# Patient Record
Sex: Male | Born: 1995 | Race: White | Hispanic: No | Marital: Single | Smoking: Never smoker
Health system: Southern US, Community
[De-identification: ages and names within clinical notes are randomized; demographics above are authoritative.]

## PROBLEM LIST (undated history)

## (undated) VITALS — BP 75/44 | HR 102 | Temp 97.6°F | Resp 17 | Ht 70.5 in | Wt 176.4 lb

## (undated) DIAGNOSIS — F419 Anxiety disorder, unspecified: Secondary | ICD-10-CM

## (undated) DIAGNOSIS — F909 Attention-deficit hyperactivity disorder, unspecified type: Secondary | ICD-10-CM

## (undated) DIAGNOSIS — F319 Bipolar disorder, unspecified: Secondary | ICD-10-CM

## (undated) HISTORY — DX: Bipolar disorder, unspecified: F31.9

## (undated) HISTORY — PX: NO PAST SURGERIES: SHX2092

---
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
## 2016-08-30 ENCOUNTER — Encounter (HOSPITAL_COMMUNITY): Payer: Self-pay | Admitting: Emergency Medicine

## 2016-08-30 ENCOUNTER — Emergency Department (HOSPITAL_COMMUNITY)
Admission: EM | Admit: 2016-08-30 | Discharge: 2016-08-30 | Disposition: A | Discharge status: Home / Self Care | Payer: BLUE CROSS/BLUE SHIELD | Attending: Emergency Medicine | Admitting: Emergency Medicine

## 2016-08-30 DIAGNOSIS — Y9389 Activity, other specified: Secondary | ICD-10-CM | POA: Insufficient documentation

## 2016-08-30 DIAGNOSIS — W268XXA Contact with other sharp object(s), not elsewhere classified, initial encounter: Secondary | ICD-10-CM | POA: Diagnosis not present

## 2016-08-30 DIAGNOSIS — Y999 Unspecified external cause status: Secondary | ICD-10-CM | POA: Diagnosis not present

## 2016-08-30 DIAGNOSIS — S61211A Laceration without foreign body of left index finger without damage to nail, initial encounter: Secondary | ICD-10-CM | POA: Diagnosis present

## 2016-08-30 DIAGNOSIS — F909 Attention-deficit hyperactivity disorder, unspecified type: Secondary | ICD-10-CM | POA: Diagnosis not present

## 2016-08-30 DIAGNOSIS — Y929 Unspecified place or not applicable: Secondary | ICD-10-CM | POA: Diagnosis not present

## 2016-08-30 DIAGNOSIS — S61412A Laceration without foreign body of left hand, initial encounter: Secondary | ICD-10-CM

## 2016-08-30 DIAGNOSIS — Z79899 Other long term (current) drug therapy: Secondary | ICD-10-CM | POA: Insufficient documentation

## 2016-08-30 MED ORDER — LIDOCAINE HCL 2 % IJ SOLN
10.0000 mL | Freq: Once | INTRAMUSCULAR | Status: AC
  Administered 2016-08-30: 200 mg
  Filled 2016-08-30: qty 20

## 2016-08-30 NOTE — ED Triage Notes (Addendum)
Patient reports left hand laceration from a piece of wood while moving furniture. Bleeding controlled.

## 2016-08-30 NOTE — ED Provider Notes (Signed)
[facility]-EMERGENCY DEPT Provider Note   CSN: [id number] Arrival date & time: 08/30/16  2021  By signing my name below, I, Alyssa GroveMartin Green, attest that this documentation has been prepared under the direction and in the presence of Newell RubbermaidJeffrey Xachary Hambly, PA-C. Electronically Signed: Alyssa GroveMartin Green, ED Scribe. 08/30/16. 10:08 PM.  History   Chief Complaint Chief Complaint  Patient presents with  . Laceration   The history is provided by the patient. No language interpreter was used.   HPI Comments: Calvin [name] is a 20 y.o. male who presents to the Emergency Department complaining of a laceration to the fist knuckle of the left index finger onset 3-4 hours. Pt was moving furniture when a piece of wood cut his hand. Bleeding is controlled without bandage or pressure. Pt denies numbness. Pt is unaware of last tetanus vaccination.  Past Medical History:  Diagnosis Date  . ADHD (attention deficit hyperactivity disorder)   . Anxiety   . Medical history non-contributory     Patient Active Problem List   Diagnosis Date Noted  . ADHD (attention deficit hyperactivity disorder), combined type 07/20/2013  . MDD (major depressive disorder), single episode, moderate (HCC) 07/20/2013  . GAD (generalized anxiety disorder) 07/20/2013    Past Surgical History:  Procedure Laterality Date  . NO PAST SURGERIES      Home Medications    Prior to Admission medications   Medication Sig Start Date End Date Taking? Authorizing Provider  lisdexamfetamine (VYVANSE) 40 MG capsule Take 1 capsule (40 mg total) by mouth daily. 07/27/13   Chauncey MannGlenn E Jennings, MD  mirtazapine (REMERON) 30 MG tablet Take 1 tablet (30 mg total) by mouth at bedtime. 07/26/13   Chauncey MannGlenn E Jennings, MD    Family History Family History  Problem Relation Age of Onset  . Anxiety disorder Mother   . Depression Sister     Social History Social History  Substance Use Topics  . Smoking status: Never Smoker  . Smokeless tobacco: Never Used  .  Alcohol use No     Allergies   Sulfa antibiotics   Review of Systems Review of Systems  Constitutional: Negative for fever.  Skin: Positive for wound.  Neurological: Negative for numbness.  All other systems reviewed and are negative.  Physical Exam Updated Vital Signs BP 126/72 (BP Location: Left Arm)   Pulse 96   Temp 98.5 F (36.9 C) (Oral)   Resp 16   Ht 6' (1.829 m)   Wt 78 kg   SpO2 99%   BMI 23.33 kg/m   Physical Exam  Constitutional: He is oriented to person, place, and time. He appears well-developed and well-nourished. He is active. No distress.  HENT:  Head: Normocephalic and atraumatic.  Eyes: Conjunctivae are normal.  Cardiovascular: Normal rate.   Pulmonary/Chest: Effort normal. No respiratory distress.  Musculoskeletal: Normal range of motion.  Neurological: He is alert and oriented to person, place, and time.  Sensation intact  Skin: Skin is warm and dry.  Left index with a curved laceration just distal to the MCP Approximately 2 cm no deep space involment No involvement in joint  Psychiatric: He has a normal mood and affect. His behavior is normal.  Nursing note and vitals reviewed.  ED Treatments / Results  DIAGNOSTIC STUDIES: Oxygen Saturation is 99% on RA, normal by my interpretation.    COORDINATION OF CARE: 8:42 PM Discussed treatment plan with pt at bedside which includes T-dap injection and laceration repair and pt agreed to plan.  Labs (all  labs ordered are listed, but only abnormal results are displayed) Labs Reviewed - No data to display  EKG  EKG Interpretation None       Radiology No results found.  Procedures Procedures (including critical care time)  LACERATION REPAIR Performed by: Thermon LeylandHedges,Marvel Mcphillips Todd Authorized by: Thermon LeylandHedges,Neill Jurewicz Todd Consent: Verbal consent obtained. Risks and benefits: risks, benefits and alternatives were discussed Consent given by: patient Patient identity confirmed: provided demographic  data Prepped and Draped in normal sterile fashion Wound explored  Laceration Location: Left index  Laceration Length: 2 cm  No Foreign Bodies seen or palpated  Anesthesia: local infiltration  Local anesthetic: lidocaine 2% 0 epinephrine  Anesthetic total: 2 ml  Irrigation method: syringe Amount of cleaning: standard  Skin closure: Simple   Number of sutures: 6 Prolene   Technique: Simple interrupted   Patient tolerance: Patient tolerated the procedure well with no immediate complications.  Medications Ordered in ED Medications  lidocaine (XYLOCAINE) 2 % (with pres) injection 200 mg (200 mg Infiltration Given by Other 08/30/16 2057)     Initial Impression / Assessment and Plan / ED Course  I have reviewed the triage vital signs and the nursing notes.  Pertinent labs & imaging results that were available during my care of the patient were reviewed by me and considered in my medical decision making (see chart for details).  Clinical Course    I personally performed the services described in this documentation, which was scribed in my presence. The recorded information has been reviewed and is accurate.    20 year old male presents with laceration to his finger. No deep space involvement, wound repaired without complication. Wound care instructions given, follow-up indications given. Patient verbalized understanding and agreement today's plan had no further questions or concerns  Final Clinical Impressions(s) / ED Diagnoses   Final diagnoses:  Laceration of left hand, foreign body presence unspecified, initial encounter    New Prescriptions New Prescriptions   No medications on file     Eyvonne MechanicJeffrey Wilman Tucker, PA-C 08/30/16 2208    Arby BarretteMarcy Pfeiffer, MD 08/31/16 0002

## 2016-08-30 NOTE — Discharge Instructions (Signed)
Please read attached information. If you experience any new or worsening signs or symptoms please return to the emergency room for evaluation. Please follow-up with your primary care provider or specialist as discussed.  °

## 2017-04-03 ENCOUNTER — Encounter (HOSPITAL_COMMUNITY): Payer: Self-pay | Admitting: Emergency Medicine

## 2017-04-03 ENCOUNTER — Ambulatory Visit (HOSPITAL_COMMUNITY)
Admission: RE | Admit: 2017-04-03 | Discharge: 2017-04-03 | Disposition: A | Discharge status: Home / Self Care | Payer: BLUE CROSS/BLUE SHIELD | Source: Home / Self Care | Attending: Psychiatry | Admitting: Psychiatry

## 2017-04-03 ENCOUNTER — Other Ambulatory Visit: Payer: Self-pay | Admitting: Family

## 2017-04-03 ENCOUNTER — Emergency Department (HOSPITAL_COMMUNITY)
Admission: EM | Admit: 2017-04-03 | Discharge: 2017-04-05 | Disposition: A | Discharge status: Other Acute Inpatient Hospital | Payer: BLUE CROSS/BLUE SHIELD | Attending: Emergency Medicine | Admitting: Emergency Medicine

## 2017-04-03 DIAGNOSIS — R45851 Suicidal ideations: Secondary | ICD-10-CM

## 2017-04-03 DIAGNOSIS — Z046 Encounter for general psychiatric examination, requested by authority: Secondary | ICD-10-CM | POA: Diagnosis not present

## 2017-04-03 DIAGNOSIS — F909 Attention-deficit hyperactivity disorder, unspecified type: Secondary | ICD-10-CM

## 2017-04-03 DIAGNOSIS — F329 Major depressive disorder, single episode, unspecified: Secondary | ICD-10-CM | POA: Insufficient documentation

## 2017-04-03 DIAGNOSIS — F332 Major depressive disorder, recurrent severe without psychotic features: Secondary | ICD-10-CM | POA: Diagnosis present

## 2017-04-03 DIAGNOSIS — Z1389 Encounter for screening for other disorder: Secondary | ICD-10-CM | POA: Insufficient documentation

## 2017-04-03 DIAGNOSIS — F419 Anxiety disorder, unspecified: Secondary | ICD-10-CM | POA: Insufficient documentation

## 2017-04-03 DIAGNOSIS — Z818 Family history of other mental and behavioral disorders: Secondary | ICD-10-CM | POA: Diagnosis not present

## 2017-04-03 DIAGNOSIS — T1491XA Suicide attempt, initial encounter: Secondary | ICD-10-CM | POA: Diagnosis not present

## 2017-04-03 DIAGNOSIS — X838XXA Intentional self-harm by other specified means, initial encounter: Secondary | ICD-10-CM | POA: Diagnosis not present

## 2017-04-03 LAB — CBC
HCT: 39.8 % (ref 39.0–52.0)
HEMOGLOBIN: 13.7 g/dL (ref 13.0–17.0)
MCH: 30.9 pg (ref 26.0–34.0)
MCHC: 34.4 g/dL (ref 30.0–36.0)
MCV: 89.6 fL (ref 78.0–100.0)
Platelets: 193 10*3/uL (ref 150–400)
RBC: 4.44 MIL/uL (ref 4.22–5.81)
RDW: 12.2 % (ref 11.5–15.5)
WBC: 2.8 10*3/uL — ABNORMAL LOW (ref 4.0–10.5)

## 2017-04-03 LAB — COMPREHENSIVE METABOLIC PANEL
ALT: 11 U/L — ABNORMAL LOW (ref 17–63)
AST: 17 U/L (ref 15–41)
Albumin: 4.4 g/dL (ref 3.5–5.0)
Alkaline Phosphatase: 52 U/L (ref 38–126)
Anion gap: 5 (ref 5–15)
BUN: 16 mg/dL (ref 6–20)
CHLORIDE: 106 mmol/L (ref 101–111)
CO2: 27 mmol/L (ref 22–32)
CREATININE: 0.75 mg/dL (ref 0.61–1.24)
Calcium: 9.5 mg/dL (ref 8.9–10.3)
GFR calc Af Amer: >60 mL/min (ref >60)
Glucose, Bld: 103 mg/dL — ABNORMAL HIGH (ref 65–99)
Potassium: 4.2 mmol/L (ref 3.5–5.1)
Sodium: 138 mmol/L (ref 135–145)
Total Bilirubin: 0.8 mg/dL (ref 0.3–1.2)
Total Protein: 7.1 g/dL (ref 6.5–8.1)

## 2017-04-03 LAB — RAPID URINE DRUG SCREEN, HOSP PERFORMED
AMPHETAMINES: NOT DETECTED
BARBITURATES: NOT DETECTED
Benzodiazepines: NOT DETECTED
Cocaine: NOT DETECTED
Opiates: NOT DETECTED
Tetrahydrocannabinol: POSITIVE — AB

## 2017-04-03 LAB — ETHANOL: Alcohol, Ethyl (B): <5 mg/dL (ref <5)

## 2017-04-03 LAB — ACETAMINOPHEN LEVEL: Acetaminophen (Tylenol), Serum: <10 ug/mL — ABNORMAL LOW (ref 10–30)

## 2017-04-03 LAB — SALICYLATE LEVEL: Salicylate Lvl: <7 mg/dL (ref 2.8–30.0)

## 2017-04-03 MED ORDER — ALUM & MAG HYDROXIDE-SIMETH 200-200-20 MG/5ML PO SUSP: 30.0000 mL | Freq: Four times a day (QID) | ORAL | Status: DC | PRN

## 2017-04-03 MED ORDER — ACETAMINOPHEN 325 MG PO TABS: 650.0000 mg | ORAL_TABLET | ORAL | Status: DC | PRN

## 2017-04-03 MED ORDER — ONDANSETRON HCL 4 MG PO TABS: 4.0000 mg | ORAL_TABLET | Freq: Three times a day (TID) | ORAL | Status: DC | PRN

## 2017-04-03 MED ORDER — ZOLPIDEM TARTRATE 5 MG PO TABS
5.0000 mg | ORAL_TABLET | Freq: Every evening | ORAL | Status: DC | PRN
  Administered 2017-04-04: 5 mg via ORAL
  Filled 2017-04-03: qty 1

## 2017-04-03 MED ORDER — IBUPROFEN 200 MG PO TABS: 600.0000 mg | ORAL_TABLET | Freq: Three times a day (TID) | ORAL | Status: DC | PRN
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
## 2017-04-03 NOTE — ED Notes (Signed)
Pt admitted to room #43. Pt behavior cooperative. Pt endorsing increase in depression. Endorsing passive SI, verbally contracts for safety. Pt denies HI. Denies AVH. Pt reports SA two days ago by putting a belt around his neck. Pt reports he has been off medication for 3 years. Encouragement and support provided. Special checks q 15 mins in place for safety, Video monitoring in place. Will continue to monitor.

## 2017-04-03 NOTE — ED Notes (Signed)
Bed: [facility] Expected date:  Expected time:  Means of arrival:  Comments: 

## 2017-04-03 NOTE — H&P (Signed)
Behavioral Health Medical Screening Exam  Calvin [name] is an 21 y.o. male.  Total Time spent with patient: 30 minutes  Psychiatric Specialty Exam: Physical Exam  Nursing note and vitals reviewed. Constitutional: He is oriented to person, place, and time. He appears well-developed.  Cardiovascular: Normal rate.   Neurological: He is alert and oriented to person, place, and time.  Psychiatric: He has a normal mood and affect. His behavior is normal.    Review of Systems  Psychiatric/Behavioral: Positive for depression and suicidal ideas. The patient is nervous/anxious.     There were no vitals taken for this visit.There is no height or weight on file to calculate BMI.  General Appearance: Casual and Guarded  Eye Contact:  Fair  Speech:  Clear and Coherent  Volume:  Normal  Mood:  Anxious and Depressed  Affect:  Appropriate and Congruent  Thought Process:  Coherent  Orientation:  Full (Time, Place, and Person)  Thought Content:  Hallucinations: Auditory and Rumination  Suicidal Thoughts:  Yes.  with intent/plan  Homicidal Thoughts:  No  Memory:  Immediate;   Good Recent;   Good Remote;   Good  Judgement:  Good  Insight:  Present  Psychomotor Activity:  Normal  Concentration: Concentration: Fair  Recall:  Fis[employer]Fair  Fund of Knowledge:Fair  Language: Fair  Akathisia:  No  Handed:  Right  AIMS (if indicated):     Assets:  Communication Skills Desire for Improvement Resilience Social Support  Sleep:       Musculoskeletal: Strength & Muscle Tone: within normal limits Gait & Station: normal Patient leans: N/A  There were no vitals taken for this visit. B/P 117/53 HR 70 RR 16 Temp 98.2 Recommendations:  Based on my evaluation the patient does not appear to have an emergency medical condition.  Oneta Rackanika N Jeannine Pennisi, NP 04/03/2017, 2:33 PM

## 2017-04-03 NOTE — ED Notes (Signed)
Pt has requested to leave. Attempted to call EDP will attempt again in 10 min.

## 2017-04-03 NOTE — ED Triage Notes (Signed)
Pt reports he has been having SI. Pt also reports he has been having impulsive and at times violent behavior. Pt tried to smother himself 3 days ago in a suicide attempt. No acute trigger. No HI. Uses marijuana occasionally.

## 2017-04-03 NOTE — ED Notes (Signed)
SBAR Report received from previous nurse. Pt received asleep on unit. Pt did not respond to questions related to current SI/ HI, A/V H, depression, anxiety, or pain at this time, and appears otherwise stable and free of distress. Pt reminded of camera surveillance, q 15 min rounds, and rules of the milieu. Will continue to assess.

## 2017-04-03 NOTE — ED Provider Notes (Signed)
[facility]-EMERGENCY DEPT Provider Note   CSN: [id number] Arrival date & time: 04/03/17  1501     History   Chief Complaint Chief Complaint  Patient presents with  . Suicidal    HPI Calvin [name] is a 21 y.o. male.  Patient presents with complaint of suicidal ideation with recent attempt. Patient endorses worsening suicidal ideation and depression recently. He states that several days ago he tried to hang himself with a belt. He has a bruise around his neck from this attempt. He denies being on any medications. No drug or alcohol other than marijuana. Denies any current medical complaints. No neck pain or difficulty breathing or swallowing. States that he was encouraged to come seek help by his girlfriend.      Past Medical History:  Diagnosis Date  . ADHD (attention deficit hyperactivity disorder)   . Anxiety   . Medical history non-contributory     Patient Active Problem List   Diagnosis Date Noted  . ADHD (attention deficit hyperactivity disorder), combined type 07/20/2013  . MDD (major depressive disorder), single episode, moderate (HCC) 07/20/2013  . GAD (generalized anxiety disorder) 07/20/2013    Past Surgical History:  Procedure Laterality Date  . NO PAST SURGERIES         Home Medications    Prior to Admission medications   Medication Sig Start Date End Date Taking? Authorizing Provider  lisdexamfetamine (VYVANSE) 40 MG capsule Take 1 capsule (40 mg total) by mouth daily. Patient not taking: Reported on 04/03/2017 07/27/13   [name], MD  mirtazapine (REMERON) 30 MG tablet Take 1 tablet (30 mg total) by mouth at bedtime. Patient not taking: Reported on 04/03/2017 07/26/13   [name], MD    Family History Family History  Problem Relation Age of Onset  . Anxiety disorder Mother   . Depression Sister     Social History Social History  Substance Use Topics  . Smoking status: Never Smoker  . Smokeless tobacco: Never Used  . Alcohol  use No     Allergies   Sulfa antibiotics   Review of Systems Review of Systems  Constitutional: Negative for fever.  HENT: Negative for rhinorrhea and sore throat.   Eyes: Negative for redness.  Respiratory: Negative for cough.   Cardiovascular: Negative for chest pain.  Gastrointestinal: Negative for abdominal pain, diarrhea, nausea and vomiting.  Genitourinary: Negative for dysuria.  Musculoskeletal: Negative for myalgias.  Skin: Negative for rash.  Neurological: Negative for headaches.  Psychiatric/Behavioral: Positive for dysphoric mood, self-injury and suicidal ideas.     Physical Exam Updated Vital Signs BP 129/74 (BP Location: Right Arm)   Pulse 67   Temp 98.1 F (36.7 C) (Oral)   Resp 18   SpO2 100%   Physical Exam  Constitutional: He appears well-developed and well-nourished.  HENT:  Head: Normocephalic and atraumatic.  Eyes: Conjunctivae are normal. Right eye exhibits no discharge. Left eye exhibits no discharge.  Neck: Normal range of motion. Neck supple.  Linear ecchymosis around the left lateral neck consistent with ligature.  Cardiovascular: Normal rate, regular rhythm and normal heart sounds.   Pulmonary/Chest: Effort normal and breath sounds normal.  Abdominal: Soft. There is no tenderness.  Neurological: He is alert.  Skin: Skin is warm and dry.  Psychiatric: He has a normal mood and affect. His speech is normal and behavior is normal. He expresses suicidal ideation. He expresses suicidal plans.  Nursing note and vitals reviewed.    ED Treatments / Results  Labs (all labs ordered are listed, but only abnormal results are displayed) Labs Reviewed  COMPREHENSIVE METABOLIC PANEL - Abnormal; Notable for the following:       Result Value   Glucose, Bld 103 (*)    ALT 11 (*)    All other components within normal limits  ACETAMINOPHEN LEVEL - Abnormal; Notable for the following:    Acetaminophen (Tylenol), Serum <10 (*)    All other components  within normal limits  CBC - Abnormal; Notable for the following:    WBC 2.8 (*)    All other components within normal limits  RAPID URINE DRUG SCREEN, HOSP PERFORMED - Abnormal; Notable for the following:    Tetrahydrocannabinol POSITIVE (*)    All other components within normal limits  ETHANOL  SALICYLATE LEVEL    EKG  EKG Interpretation None       Radiology No results found.  Procedures Procedures (including critical care time)  Medications Ordered in ED Medications  acetaminophen (TYLENOL) tablet 650 mg (not administered)  alum & mag hydroxide-simeth (MAALOX/MYLANTA) 200-200-20 MG/5ML suspension 30 mL (not administered)  ibuprofen (ADVIL,MOTRIN) tablet 600 mg (not administered)  ondansetron (ZOFRAN) tablet 4 mg (not administered)  zolpidem (AMBIEN) tablet 5 mg (not administered)     Initial Impression / Assessment and Plan / ED Course  I have reviewed the triage vital signs and the nursing notes.  Pertinent labs & imaging results that were available during my care of the patient were reviewed by me and considered in my medical decision making (see chart for details).     Patient seen and examined. TTS consult ordered. Patient will likely need inpatient treatment.  Vital signs reviewed and are as follows: BP 129/74 (BP Location: Right Arm)   Pulse 67   Temp 98.1 F (36.7 C) (Oral)   Resp 18   SpO2 100%     Final Clinical Impressions(s) / ED Diagnoses   Final diagnoses:  Suicidal ideation   Pending placement.   New Prescriptions New Prescriptions   No medications on file     Renne CriglerGeiple, Nevan Creighton, Cordelia Poche-C 04/03/17 2231    Shaune PollackIsaacs, Cameron, MD 04/06/17 [phone]

## 2017-04-04 DIAGNOSIS — X838XXA Intentional self-harm by other specified means, initial encounter: Secondary | ICD-10-CM

## 2017-04-04 DIAGNOSIS — Z818 Family history of other mental and behavioral disorders: Secondary | ICD-10-CM

## 2017-04-04 DIAGNOSIS — F332 Major depressive disorder, recurrent severe without psychotic features: Secondary | ICD-10-CM | POA: Diagnosis not present

## 2017-04-04 DIAGNOSIS — T1491XA Suicide attempt, initial encounter: Secondary | ICD-10-CM | POA: Diagnosis not present

## 2017-04-04 DIAGNOSIS — F1290 Cannabis use, unspecified, uncomplicated: Secondary | ICD-10-CM

## 2017-04-04 MED ORDER — TRAZODONE HCL 50 MG PO TABS: 50.0000 mg | ORAL_TABLET | Freq: Every evening | ORAL | Status: DC | PRN

## 2017-04-04 MED ORDER — LORAZEPAM 2 MG/ML IJ SOLN
1.0000 mg | Freq: Once | INTRAMUSCULAR | Status: DC
Start: 2017-04-04 — End: 2017-04-05

## 2017-04-04 MED ORDER — DIPHENHYDRAMINE HCL 50 MG/ML IJ SOLN: 25.0000 mg | Freq: Once | INTRAMUSCULAR | Status: DC

## 2017-04-04 MED ORDER — CARBAMAZEPINE ER 200 MG PO TB12
200.0000 mg | ORAL_TABLET | Freq: Two times a day (BID) | ORAL | Status: DC
  Administered 2017-04-04 – 2017-04-05 (×3): 200 mg via ORAL
  Filled 2017-04-04 (×3): qty 1

## 2017-04-04 MED ORDER — ZIPRASIDONE MESYLATE 20 MG IM SOLR: 10.0000 mg | Freq: Once | INTRAMUSCULAR | Status: DC

## 2017-04-04 MED ORDER — CITALOPRAM HYDROBROMIDE 10 MG PO TABS
10.0000 mg | ORAL_TABLET | Freq: Every day | ORAL | Status: DC
  Administered 2017-04-04 – 2017-04-05 (×2): 10 mg via ORAL
  Filled 2017-04-04 (×2): qty 1

## 2017-04-04 NOTE — ED Notes (Signed)
Pt became agitated on the phone. RN coached the patient with some breathing exercise which he was able to calm down. The tech had to go back and readdress the  Pt's behavior, he remained upset but calm down within minutes. Notified MD resulted in his agitation.

## 2017-04-04 NOTE — Progress Notes (Signed)
Patient has been [facility] inpatient psych [facility], [facility], [facility], [facility], [facility].  CSW will continue to follow up with placement efforts in the morning.  [place] Abtsatia Mischele Detter, LCSWA Disposition staff 04/04/2017 2:20 PM

## 2017-04-04 NOTE — ED Notes (Signed)
Tech, Charity fundraiserN, and Child psychotherapistsocial worker educated girlfriend and pt on [facility] and [employer]

## 2017-04-04 NOTE — ED Provider Notes (Signed)
Pt's nurse called me b/c pt wants to go home.  He came in earlier in the day with a recent suicide attempt by trying to hang himself with a belt.  [facility].  I told him that it would not be safe for him to go home right now.  He is willing to stay and be reassessed in the morning.  Pt is cooperative.  No need for IVC papers now.   Jacalyn LefevreHaviland, Laruth Hanger, MD 04/04/17 [phone]

## 2017-04-04 NOTE — ED Notes (Signed)
Educated pt on Calvin [name] and the difference between [employer] and [employer]. Pt stated "I want to talk to my girlfriend, so we can talk about rent being paid and my job." He notified his girlfriend and now awaiting on [facility] to [facility].

## 2017-04-04 NOTE — ED Notes (Signed)
EDP met with pt and pt has agreed to stay the night and be re-evaluated in the morning.

## 2017-04-04 NOTE — Progress Notes (Signed)
Calvin [name] from [facility] contacted Clinical research associatewriter and inquired about patient's clinicals. Writer consulted with [facility]-ED RN Angelique Blonderenise and answered these questions (if any infectious diseases, any MR, any medical equipment, any forced meds or restraints, and if patient will be voluntary).  Per [facility] RN Angelique Blonderenise, [facility] but wants to talk to his family first [facility].  Writer awaiting call back from [employer] Denise. Daniel [facility] to be informed of pt status.  [name], LCSWA Disposition staff 04/04/2017 2:50 PM

## 2017-04-04 NOTE — ED Notes (Signed)
SBAR Report received from previous nurse. Pt received calm and visible on unit. Pt denies current SI/ HI, A/V H, depression, anxiety, or pain at this time, and appears otherwise stable and free of distress. Pt reminded of camera surveillance, q 15 min rounds, and rules of the milieu. Will continue to assess. 

## 2017-04-04 NOTE — ED Notes (Signed)
Notified Reuel Boomaniel, RN [facility] will [facility] the am. [address] [place] [address]

## 2017-04-04 NOTE — Progress Notes (Signed)
If patient will be IVC'd please fax [facility] at fax# [phone]. [facility]-ED RN Angelique BlonderDenise was notified.  Calvin [name], LCSWA Disposition staff 04/04/2017 3:19 PM

## 2017-04-04 NOTE — Consult Note (Signed)
[place] [facility] Consult   Reason for Consult:  Suicide attempt per hanging Referring Physician:  EDP Patient Identification: Calvin [name] MRN:  [id number] Principal Diagnosis: Major depressive disorder, recurrent severe without psychotic features [facility]) Diagnosis:   Patient Active Problem List   Diagnosis Date Noted  . Major depressive disorder, recurrent severe without psychotic features ([place]) [F33.2] 04/04/2017    Priority: High  . ADHD (attention deficit hyperactivity disorder), combined type [F90.2] 07/20/2013  . GAD (generalized anxiety disorder) [F41.1] 07/20/2013    Total Time spent with patient: 45 minutes  Subjective:   Calvin [name] is a 21 y.o. male patient admitted with suicide attempt.  HPI:  21 yo male who presented to the ED with suicidal ideations and failed hanging attempt.  He continues to have suicidal ideations but no homicidal ideations, hallucinations, or withdrawal symptoms (uses cannabis).  He used to go to [facility] for counseling but has not been recently.    Past Psychiatric History: depression  Risk to Self: Is patient at risk for suicide?: Yes Risk to Others:  None Prior Inpatient Therapy:  Denies Prior Outpatient [facility]  Past Medical History:  Past Medical History:  Diagnosis Date  . ADHD (attention deficit hyperactivity disorder)   . Anxiety   . Medical history non-contributory     Past Surgical History:  Procedure Laterality Date  . NO PAST SURGERIES     Family History:  Family History  Problem Relation Age of Onset  . Anxiety disorder Mother   . Depression Sister    Family Psychiatric  History: unknown Social History:  History  Alcohol Use No     History  Drug Use  . Types: Marijuana    Comment: once monthly/ 1 blunt/ quit a month ago due to meds     Social History   Social History  . Marital status: Single    Spouse name: N/A  . Number of children: N/A  . Years of education: N/A   Social  History Main Topics  . Smoking status: Never Smoker  . Smokeless tobacco: Never Used  . Alcohol use No  . Drug use: Yes    Types: Marijuana     Comment: once monthly/ 1 blunt/ quit a month ago due to meds   . Sexual activity: No   Other Topics Concern  . None   Social History Narrative  . None   Additional Social History:    Allergies:   Allergies  Allergen Reactions  . Sulfa Antibiotics Rash    Labs:  Results for orders placed or performed during the hospital encounter of 04/03/17 (from the past 48 hour(s))  Rapid urine drug screen (hospital performed)     Status: Abnormal   Collection Time: 04/03/17  3:24 PM  Result Value Ref Range   Opiates NONE DETECTED NONE DETECTED   Cocaine NONE DETECTED NONE DETECTED   Benzodiazepines NONE DETECTED NONE DETECTED   Amphetamines NONE DETECTED NONE DETECTED   Tetrahydrocannabinol POSITIVE (A) NONE DETECTED   Barbiturates NONE DETECTED NONE DETECTED    Comment:        DRUG SCREEN FOR MEDICAL PURPOSES ONLY.  IF CONFIRMATION IS NEEDED FOR ANY PURPOSE, NOTIFY LAB WITHIN 5 DAYS.        LOWEST DETECTABLE LIMITS FOR URINE DRUG SCREEN Drug Class       Cutoff (ng/mL) Amphetamine      1000 Barbiturate      200 Benzodiazepine   637 Tricyclics  300 Opiates          300 Cocaine          300 THC              50   Comprehensive metabolic panel     Status: Abnormal   Collection Time: 04/03/17  3:58 PM  Result Value Ref Range   Sodium 138 135 - 145 mmol/L   Potassium 4.2 3.5 - 5.1 mmol/L   Chloride 106 101 - 111 mmol/L   CO2 27 22 - 32 mmol/L   Glucose, Bld 103 (H) 65 - 99 mg/dL   BUN 16 6 - 20 mg/dL   Creatinine, Ser 0.75 0.61 - 1.24 mg/dL   Calcium 9.5 8.9 - 10.3 mg/dL   Total Protein 7.1 6.5 - 8.1 g/dL   Albumin 4.4 3.5 - 5.0 g/dL   AST 17 15 - 41 U/L   ALT 11 (L) 17 - 63 U/L   Alkaline Phosphatase 52 38 - 126 U/L   Total Bilirubin 0.8 0.3 - 1.2 mg/dL   GFR calc non Af Amer >60 >60 mL/min   GFR calc Af Amer >60 >60  mL/min    Comment: (NOTE) The eGFR has been calculated using the CKD EPI equation. This calculation has not been validated in all clinical situations. eGFR's persistently <60 mL/min signify possible Chronic Kidney Disease.    Anion gap 5 5 - 15  cbc     Status: Abnormal   Collection Time: 04/03/17  3:58 PM  Result Value Ref Range   WBC 2.8 (L) 4.0 - 10.5 K/uL   RBC 4.44 4.22 - 5.81 MIL/uL   Hemoglobin 13.7 13.0 - 17.0 g/dL   HCT 39.8 39.0 - 52.0 %   MCV 89.6 78.0 - 100.0 fL   MCH 30.9 26.0 - 34.0 pg   MCHC 34.4 30.0 - 36.0 g/dL   RDW 12.2 11.5 - 15.5 %   Platelets 193 150 - 400 K/uL  Ethanol     Status: None   Collection Time: 04/03/17  3:59 PM  Result Value Ref Range   Alcohol, Ethyl (B) <5 <5 mg/dL    Comment:        LOWEST DETECTABLE LIMIT FOR SERUM ALCOHOL IS 5 mg/dL FOR MEDICAL PURPOSES ONLY   Salicylate level     Status: None   Collection Time: 04/03/17  3:59 PM  Result Value Ref Range   Salicylate Lvl <1.6 2.8 - 30.0 mg/dL  Acetaminophen level     Status: Abnormal   Collection Time: 04/03/17  3:59 PM  Result Value Ref Range   Acetaminophen (Tylenol), Serum <10 (L) 10 - 30 ug/mL    Comment:        THERAPEUTIC CONCENTRATIONS VARY SIGNIFICANTLY. A RANGE OF 10-30 ug/mL MAY BE AN EFFECTIVE CONCENTRATION FOR MANY PATIENTS. HOWEVER, SOME ARE BEST TREATED AT CONCENTRATIONS OUTSIDE THIS RANGE. ACETAMINOPHEN CONCENTRATIONS >150 ug/mL AT 4 HOURS AFTER INGESTION AND >50 ug/mL AT 12 HOURS AFTER INGESTION ARE OFTEN ASSOCIATED WITH TOXIC REACTIONS.     Current Facility-Administered Medications  Medication Dose Route Frequency Provider Last Rate Last Dose  . acetaminophen (TYLENOL) tablet 650 mg  650 mg Oral Q4H PRN Carlisle Cater, PA-C      . alum & mag hydroxide-simeth (MAALOX/MYLANTA) 200-200-20 MG/5ML suspension 30 mL  30 mL Oral Q6H PRN Carlisle Cater, PA-C      . ibuprofen (ADVIL,MOTRIN) tablet 600 mg  600 mg Oral Q8H PRN Carlisle Cater, PA-C      .  ondansetron  (ZOFRAN) tablet 4 mg  4 mg Oral Q8H PRN Carlisle Cater, PA-C       Current Outpatient Prescriptions  Medication Sig Dispense Refill  . lisdexamfetamine (VYVANSE) 40 MG capsule Take 1 capsule (40 mg total) by mouth daily. (Patient not taking: Reported on 04/03/2017) 30 capsule 0  . mirtazapine (REMERON) 30 MG tablet Take 1 tablet (30 mg total) by mouth at bedtime. (Patient not taking: Reported on 04/03/2017) 30 tablet 0    Musculoskeletal: Strength & Muscle Tone: within normal limits Gait & Station: normal Patient leans: N/A  Psychiatric Specialty Exam: Physical Exam  Constitutional: He is oriented to person, place, and time. He appears well-developed and well-nourished.  HENT:  Head: Normocephalic.  Neck: Normal range of motion.  Respiratory: Effort normal.  Musculoskeletal: Normal range of motion.  Neurological: He is alert and oriented to person, place, and time.  Psychiatric: His speech is normal and behavior is normal. Judgment normal. Cognition and memory are normal. He exhibits a depressed mood. He expresses suicidal ideation. He expresses suicidal plans.    Review of Systems  Psychiatric/Behavioral: Positive for depression and suicidal ideas.  All other systems reviewed and are negative.   Blood pressure (!) 117/57, pulse (!) 51, temperature 97.6 F (36.4 C), temperature source Oral, resp. rate 18, SpO2 100 %.There is no height or weight on file to calculate BMI.  General Appearance: Casual  Eye Contact:  Fair  Speech:  Normal Rate  Volume:  Decreased  Mood:  Depressed  Affect:  Congruent  Thought Process:  Coherent and Descriptions of Associations: Intact  Orientation:  Full (Time, Place, and Person)  Thought Content:  Rumination  Suicidal Thoughts:  Yes.  with intent/plan  Homicidal Thoughts:  No  Memory:  Immediate;   Fair Recent;   Fair Remote;   Fair  Judgement:  Poor  Insight:  Fair  Psychomotor Activity:  Decreased  Concentration:  Concentration: Fair and  Attention Span: Fair  Recall:  [place] of Knowledge:  Good  Language:  Good  Akathisia:  No  Handed:  Right  AIMS (if indicated):     Assets:  Housing Intimacy Leisure Time Physical Health Resilience Social Support  ADL's:  Intact  Cognition:  WNL  Sleep:        Treatment Plan Summary: Daily contact with patient to assess and evaluate symptoms and progress in treatment, Medication management and Plan major depressive disorder, recurrent, severe without psychosis:  -Crisis stabilization -Medication management:  Start Tegretol 200 mg BID for mood stabilization and Celexa 10 mg daily for depression, Trazodone 50 mg at bedtime for sleep PRN -Individual counseling  Disposition: Recommend psychiatric Inpatient admission when medically cleared.  Waylan Boga, NP 04/04/2017 12:00 PM  Patient seen face-to-face for psychiatric evaluation, chart reviewed and case discussed with the physician extender and developed treatment plan. Reviewed the information documented and agree with the treatment plan. Corena Pilgrim, MD

## 2017-04-04 NOTE — Progress Notes (Addendum)
[facility] was [facility], to Dr. Shawnie DapperLopez, going to [facility] building, the bed is ready, please call report at [phone] [facility](RN Allena Katzaniel Williams).   Please follow up with CSW in disposition Catia Tyreece Gelles at [phone] if patient will be IVC'd for transfer.  Melbourne Abtsatia Nathanel Tallman, LCSWA Disposition staff 04/04/2017 3:08 PM

## 2017-04-04 NOTE — ED Notes (Signed)
Creig HinesJames Donovan (father) called, informed him that pt will be going [facility]. Father [facility] the [facility]. Father (Emergency planning/management officerpolice officer) he is familiar with the process.

## 2017-04-05 NOTE — [facility] Counselor (Signed)
Clinician faxed pt's [facility] paperwork [facility] at [phone].    Redmond Pullingreylese D Mumtaz Lovins, MS, [facility], [facility] Triage Specialist [phone]

## 2017-04-05 NOTE — ED Notes (Signed)
[facility] [facility] He was disgruntled because his status was IVC and the doctors would not change him to voluntary. He did not want to [facility]. When the sheriff came he went without difficulty. All belongings returned to pt who signed for same.

## 2017-05-03 ENCOUNTER — Emergency Department (HOSPITAL_COMMUNITY)
Admission: EM | Admit: 2017-05-03 | Discharge: 2017-05-03 | Disposition: A | Discharge status: Home / Self Care | Payer: BLUE CROSS/BLUE SHIELD | Attending: Emergency Medicine | Admitting: Emergency Medicine

## 2017-05-03 ENCOUNTER — Emergency Department (HOSPITAL_COMMUNITY): Payer: BLUE CROSS/BLUE SHIELD

## 2017-05-03 ENCOUNTER — Encounter (HOSPITAL_COMMUNITY): Payer: Self-pay | Admitting: Emergency Medicine

## 2017-05-03 DIAGNOSIS — Y939 Activity, unspecified: Secondary | ICD-10-CM | POA: Diagnosis not present

## 2017-05-03 DIAGNOSIS — S51812A Laceration without foreign body of left forearm, initial encounter: Secondary | ICD-10-CM | POA: Diagnosis not present

## 2017-05-03 DIAGNOSIS — Y999 Unspecified external cause status: Secondary | ICD-10-CM | POA: Insufficient documentation

## 2017-05-03 DIAGNOSIS — W25XXXA Contact with sharp glass, initial encounter: Secondary | ICD-10-CM | POA: Insufficient documentation

## 2017-05-03 DIAGNOSIS — Z79899 Other long term (current) drug therapy: Secondary | ICD-10-CM | POA: Insufficient documentation

## 2017-05-03 DIAGNOSIS — Y929 Unspecified place or not applicable: Secondary | ICD-10-CM | POA: Insufficient documentation

## 2017-05-03 DIAGNOSIS — S41112A Laceration without foreign body of left upper arm, initial encounter: Secondary | ICD-10-CM

## 2017-05-03 MED ORDER — ONDANSETRON 8 MG PO TBDP
8.0000 mg | ORAL_TABLET | Freq: Once | ORAL | Status: AC
  Administered 2017-05-03: 8 mg via ORAL
  Filled 2017-05-03: qty 1

## 2017-05-03 MED ORDER — CEPHALEXIN 500 MG PO CAPS: 500.0000 mg | ORAL_CAPSULE | Freq: Two times a day (BID) | ORAL | 0 refills | Status: DC

## 2017-05-03 MED ORDER — LIDOCAINE-EPINEPHRINE (PF) 2 %-1:200000 IJ SOLN
INTRAMUSCULAR | Status: AC
  Administered 2017-05-03: 20 mL
  Filled 2017-05-03: qty 20

## 2017-05-03 NOTE — ED Notes (Signed)
ED Provider at bedside. 

## 2017-05-03 NOTE — Discharge Instructions (Signed)
Please read attached information. If you experience any new or worsening signs or symptoms please return to the emergency room for evaluation. Please follow-up with your primary care provider or specialist as discussed. Please use medication prescribed only as directed and discontinue taking if you have any concerning signs or symptoms.   °

## 2017-05-03 NOTE — ED Provider Notes (Signed)
[facility]-EMERGENCY DEPT Provider Note   CSN: [id number] Arrival date & time: 05/03/17  1202   By signing my name below, I, Calvin [name], attest that this documentation has been prepared under the direction and in the presence of Calvin Forts, PA-C Electronically Signed: Soijett [name], ED Scribe. 05/03/17. 12:34 PM.  History   Chief Complaint Chief Complaint  Patient presents with  . Arm Injury    HPI  Calvin [name] is a 21 y.o. male who presents to the Emergency Department complaining of left arm injury occurring PTA. Pt reports associated multiple lacerations to left forearm. Pt has tried applying pressure without medications with minimal relief of his symptoms. He notes that he punched a glass window pane on a door prior to the onset of his symptoms. He states that he is unsure of the status of his tetanus vaccination. He denies color change, swelling, left forearm pain, and any other symptoms.    The history is provided by the patient. No language interpreter was used.    Past Medical History:  Diagnosis Date  . ADHD (attention deficit hyperactivity disorder)   . Anxiety   . Medical history non-contributory     Patient Active Problem List   Diagnosis Date Noted  . Major depressive disorder, recurrent severe without psychotic features ([facility]) 04/04/2017  . ADHD (attention deficit hyperactivity disorder), combined type 07/20/2013  . GAD (generalized anxiety disorder) 07/20/2013    Past Surgical History:  Procedure Laterality Date  . NO PAST SURGERIES         Home Medications    Prior to Admission medications   Medication Sig Start Date End Date Taking? Authorizing Provider  divalproex (DEPAKOTE) 500 MG DR tablet Take 500 mg by mouth 2 (two) times daily.   Yes [provider]  cephALEXin (KEFLEX) 500 MG capsule Take 1 capsule (500 mg total) by mouth 2 (two) times daily. 05/03/17   Calvin [name], Calvin Gens, PA-C  lisdexamfetamine (VYVANSE) 40 MG capsule Take 1 capsule (40  mg total) by mouth daily. Patient not taking: Reported on 04/03/2017 07/27/13   Calvin Mann, MD  mirtazapine (REMERON) 30 MG tablet Take 1 tablet (30 mg total) by mouth at bedtime. Patient not taking: Reported on 04/03/2017 07/26/13   Calvin Mann, MD    Family History Family History  Problem Relation Age of Onset  . Anxiety disorder Mother   . Depression Sister     Social History Social History  Substance Use Topics  . Smoking status: Never Smoker  . Smokeless tobacco: Never Used  . Alcohol use No     Allergies   Sulfa antibiotics   Review of Systems Review of Systems  Musculoskeletal: Negative for arthralgias and joint swelling.  Skin: Positive for wound (multiple lacerations to left forearm). Negative for color change.  All other systems reviewed and are negative.    Physical Exam Updated Vital Signs BP 114/61   Pulse 60   Temp 98.2 F (36.8 C) (Oral)   Resp 20   SpO2 100%   Physical Exam  Constitutional: He is oriented to person, place, and time. He appears well-developed and well-nourished. No distress.  HENT:  Head: Normocephalic and atraumatic.  Eyes: EOM are normal.  Neck: Neck supple.  Cardiovascular: Normal rate.   Pulmonary/Chest: Effort normal. No respiratory distress.  Abdominal: He exhibits no distension.  Musculoskeletal: Normal range of motion.       Left forearm: He exhibits laceration.  Numerous lacerations to LUE, several gaping opening, most notably  2 cm laceration on mid forearm, 3 cm laceration to the mid forearm, 4 cm laceration mid forearm, 1.5 cm laceration to the proximal ulnar hand, and 0.5 cm laceration over the left thumb. Various other superficial abrasions and lacerations. No deep space involvement. Full active ROM of wrists, hand, and finger. No major muscle involvement. Sensation intact to entire LUE.  Neurological: He is alert and oriented to person, place, and time.  Skin: Skin is warm and dry.  Psychiatric: He has a  normal mood and affect. His behavior is normal.  Nursing note and vitals reviewed.    ED Treatments / Results  DIAGNOSTIC STUDIES: Oxygen Saturation is 100% on RA, nl by my interpretation.    COORDINATION OF CARE: 12:29 PM Discussed treatment plan with pt at bedside and pt agreed to plan.   Labs (all labs ordered are listed, but only abnormal results are displayed) Labs Reviewed - No data to display  EKG  EKG Interpretation None       Radiology Dg Forearm Left  Result Date: 05/03/2017 CLINICAL DATA:  Laceration of forearm. EXAM: LEFT FOREARM - 2 VIEW COMPARISON:  None. FINDINGS: There is no evidence of fracture or other focal bone lesions. Irregularity is seen involving the volar soft tissues consistent with laceration. No definite radiopaque foreign body is noted. IMPRESSION: Laceration is seen involving the volar soft tissues of the distal left forearm. No definite radiopaque foreign body is noted. No fracture or dislocation is noted. Electronically Signed   By: Calvin [name]  Green Jr, M.D.   On: 05/03/2017 13:06    Procedures .Marland Kitchen.Laceration Repair Date/Time: 05/03/2017 12:20 PM Performed by: Curlene DolphinHEDGES, Shona Pardo Authorized by: Curlene DolphinHEDGES, Darrelle Barrell   Consent:    Consent obtained:  Verbal   Consent given by:  Patient   Risks discussed:  Need for additional repair Anesthesia (see MAR for exact dosages):    Anesthesia method:  Local infiltration   Local anesthetic:  Lidocaine 2% WITH epi (10 cc used) Laceration details:    Location:  Shoulder/arm   Shoulder/arm location:  L lower arm   Wound length (cm): 3 cm, 0.5 cm, 2 cm, 4 cm, and 1.5 cm. Repair type:    Repair type:  Intermediate Pre-procedure details:    Preparation:  Patient was prepped and draped in usual sterile fashion and imaging obtained to evaluate for foreign bodies Exploration:    Hemostasis achieved with:  Direct pressure and epinephrine   Wound exploration: wound explored through full range of motion and entire depth  of wound probed and visualized     Wound extent: no foreign bodies/material noted     Contaminated: no   Treatment:    Area cleansed with:  Saline and Betadine   Amount of cleaning:  Standard   Irrigation solution:  Sterile saline   Irrigation method:  Syringe   Visualized foreign bodies/material removed: no   Skin repair:    Repair method:  Sutures   Suture size:  4-0   Suture material:  Prolene   Suture technique:  Simple interrupted   Number of sutures:  22 (5, 5, 8, 1, and 3 sutures to the respective lacerations) Approximation:    Approximation:  Close Post-procedure details:    Dressing:  Adhesive bandage and sterile dressing   Patient tolerance of procedure:  Tolerated well, no immediate complications      (including critical care time)  Medications Ordered in ED Medications  ondansetron (ZOFRAN-ODT) disintegrating tablet 8 mg (8 mg Oral Given 05/03/17 1228)  lidocaine-EPINEPHrine (  XYLOCAINE W/EPI) 2 %-1:200000 (PF) injection (20 mLs  Given by Other 05/03/17 1228)     Initial Impression / Assessment and Plan / ED Course  I have reviewed the triage vital signs and the nursing notes.  Pertinent labs & imaging results that were available during my care of the patient were reviewed by me and considered in my medical decision making (see chart for details).      Assessment/Plan: Pt presents with multiple lacerations to left forearm s/p punching glass window. Tetanus updated in ED. Laceration occurred < 12 hours prior to repair. Discussed laceration care with pt and answered questions. Pt to f-u for suture removal in 7 days and wound check sooner should there be signs of infection. Pt will be discharged home with keflex prescriptions. Pt is hemodynamically stable with no complaints prior to dc.    Final Clinical Impressions(s) / ED Diagnoses   Final diagnoses:  Laceration of left upper extremity, initial encounter    New Prescriptions Discharge Medication List as of  05/03/2017  2:41 PM    START taking these medications   Details  cephALEXin (KEFLEX) 500 MG capsule Take 1 capsule (500 mg total) by mouth 2 (two) times daily., Starting Mon 05/03/2017, Print       I personally performed the services described in this documentation, which was scribed in my presence. The recorded information has been reviewed and is accurate.    Eyvonne MechanicHedges, Adanna Zuckerman, PA-C 05/03/17 Brooke Pace1957    Alvira MondaySchlossman, Erin, MD 05/04/17 [phone]

## 2017-05-03 NOTE — ED Triage Notes (Signed)
Pt punched through window today. Pt has multiple lacerations on L forearm. No obvious deformity.

## 2018-01-03 ENCOUNTER — Encounter (HOSPITAL_COMMUNITY): Payer: Self-pay

## 2018-01-03 ENCOUNTER — Other Ambulatory Visit: Payer: Self-pay

## 2018-01-03 ENCOUNTER — Emergency Department (HOSPITAL_COMMUNITY)
Admission: EM | Admit: 2018-01-03 | Discharge: 2018-01-03 | Disposition: A | Discharge status: Home / Self Care | Payer: BLUE CROSS/BLUE SHIELD | Attending: Emergency Medicine | Admitting: Emergency Medicine

## 2018-01-03 ENCOUNTER — Emergency Department (HOSPITAL_COMMUNITY): Payer: BLUE CROSS/BLUE SHIELD

## 2018-01-03 DIAGNOSIS — R569 Unspecified convulsions: Secondary | ICD-10-CM | POA: Diagnosis present

## 2018-01-03 LAB — URINALYSIS, ROUTINE W REFLEX MICROSCOPIC
Bacteria, UA: NONE SEEN
Bilirubin Urine: NEGATIVE
GLUCOSE, UA: NEGATIVE mg/dL
Ketones, ur: NEGATIVE mg/dL
LEUKOCYTES UA: NEGATIVE
NITRITE: NEGATIVE
PH: 6 (ref 5.0–8.0)
PROTEIN: NEGATIVE mg/dL
Specific Gravity, Urine: 1.012 (ref 1.005–1.030)

## 2018-01-03 LAB — RAPID URINE DRUG SCREEN, HOSP PERFORMED
Amphetamines: NOT DETECTED
BARBITURATES: NOT DETECTED
Benzodiazepines: NOT DETECTED
COCAINE: NOT DETECTED
Opiates: NOT DETECTED
TETRAHYDROCANNABINOL: POSITIVE — AB

## 2018-01-03 LAB — COMPREHENSIVE METABOLIC PANEL
ALK PHOS: 58 U/L (ref 38–126)
ALT: 10 U/L — AB (ref 17–63)
AST: 23 U/L (ref 15–41)
Albumin: 3.9 g/dL (ref 3.5–5.0)
Anion gap: 11 (ref 5–15)
BUN: 10 mg/dL (ref 6–20)
CO2: 21 mmol/L — AB (ref 22–32)
CREATININE: 0.73 mg/dL (ref 0.61–1.24)
Calcium: 9.3 mg/dL (ref 8.9–10.3)
Chloride: 106 mmol/L (ref 101–111)
GFR calc non Af Amer: >60 mL/min (ref >60)
GLUCOSE: 89 mg/dL (ref 65–99)
Potassium: 3.9 mmol/L (ref 3.5–5.1)
SODIUM: 138 mmol/L (ref 135–145)
Total Bilirubin: 0.5 mg/dL (ref 0.3–1.2)
Total Protein: 6.6 g/dL (ref 6.5–8.1)

## 2018-01-03 LAB — CBC WITH DIFFERENTIAL/PLATELET
BASOS ABS: 0 10*3/uL (ref 0.0–0.1)
Basophils Relative: 0 %
Eosinophils Absolute: 0.1 10*3/uL (ref 0.0–0.7)
Eosinophils Relative: 2 %
HCT: 38.6 % — ABNORMAL LOW (ref 39.0–52.0)
HEMOGLOBIN: 12.6 g/dL — AB (ref 13.0–17.0)
LYMPHS ABS: 1.8 10*3/uL (ref 0.7–4.0)
LYMPHS PCT: 36 %
MCH: 29.7 pg (ref 26.0–34.0)
MCHC: 32.6 g/dL (ref 30.0–36.0)
MCV: 91 fL (ref 78.0–100.0)
Monocytes Absolute: 0.3 10*3/uL (ref 0.1–1.0)
Monocytes Relative: 6 %
NEUTROS PCT: 56 %
Neutro Abs: 2.8 10*3/uL (ref 1.7–7.7)
Platelets: 212 10*3/uL (ref 150–400)
RBC: 4.24 MIL/uL (ref 4.22–5.81)
RDW: 12.5 % (ref 11.5–15.5)
WBC: 5.1 10*3/uL (ref 4.0–10.5)

## 2018-01-03 NOTE — ED Triage Notes (Signed)
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 Pt has no hx of seizures, but hx of ADHD; currently taking no meds. Pt does c/o dizziness.

## 2018-01-03 NOTE — Discharge Instructions (Signed)
No driving until evaluated by neurology.  Drink plenty of fluids.

## 2018-01-03 NOTE — ED Provider Notes (Signed)
[facility] EMERGENCY DEPARTMENT Provider Note   CSN: [id number] Arrival date & time: 01/03/18  1520     History   Chief Complaint Chief Complaint  Patient presents with  . Seizures    HPI Calvin [name] is a 22 y.o. male.  The history is provided by the patient. No language interpreter was used.  Seizures   This is a new problem. The current episode started 3 to 5 hours ago. The problem has been resolved. There was 1 seizure. Pertinent negatives include no sleepiness, no visual disturbance and no vomiting. Characteristics include rhythmic jerking. The episode was witnessed. The seizure(s) had no focality. Possible causes do not include medication or dosage change, sleep deprivation, missed seizure meds, recent illness or change in alcohol use. There has been no fever. There were no medications administered prior to arrival.   Pt reports he was working today and he blacked out.  Pt reports he was bending over and felt light headed.  Pt reports he woke up on the ground and was told he passed out and had jerking.   Past Medical History:  Diagnosis Date  . ADHD (attention deficit hyperactivity disorder)   . Anxiety   . Medical history non-contributory     Patient Active Problem List   Diagnosis Date Noted  . Major depressive disorder, recurrent severe without psychotic features (HCC) 04/04/2017  . ADHD (attention deficit hyperactivity disorder), combined type 07/20/2013  . GAD (generalized anxiety disorder) 07/20/2013    Past Surgical History:  Procedure Laterality Date  . NO PAST SURGERIES          Home Medications    Prior to Admission medications   Medication Sig Start Date End Date Taking? Authorizing Provider  lisdexamfetamine (VYVANSE) 40 MG capsule Take 1 capsule (40 mg total) by mouth daily. Patient not taking: Reported on 04/03/2017 07/27/13   [name], MD  mirtazapine (REMERON) 30 MG tablet Take 1 tablet (30 mg total) by mouth at  bedtime. Patient not taking: Reported on 04/03/2017 07/26/13   [name], MD    Family History Family History  Problem Relation Age of Onset  . Anxiety disorder Mother   . Depression Sister     Social History Social History   Tobacco Use  . Smoking status: Never Smoker  . Smokeless tobacco: Never Used  Substance Use Topics  . Alcohol use: No  . Drug use: Yes    Types: Marijuana    Comment: once monthly/ 1 blunt/ quit a month ago due to meds      Allergies   Sulfa antibiotics   Review of Systems Review of Systems  Eyes: Negative for visual disturbance.  Gastrointestinal: Negative for vomiting.  Neurological: Positive for seizures.  All other systems reviewed and are negative.    Physical Exam Updated Vital Signs Pulse 88   Ht 6' (1.829 m)   Wt 77.1 kg (170 lb)   BMI 23.06 kg/m   Physical Exam  Constitutional: He appears well-developed and well-nourished.  HENT:  Head: Normocephalic and atraumatic.  Right Ear: External ear normal.  Left Ear: External ear normal.  Mouth/Throat: Oropharynx is clear and moist.  Eyes: Conjunctivae are normal.  Neck: Neck supple.  Cardiovascular: Normal rate and regular rhythm.  No murmur heard. Pulmonary/Chest: Effort normal and breath sounds normal. No respiratory distress.  Abdominal: Soft. There is no tenderness.  Musculoskeletal: He exhibits no edema.  Neurological: He is alert.  Skin: Skin is warm and dry.  Psychiatric: He has a normal mood and affect.  Nursing note and vitals reviewed.    ED Treatments / Results  Labs (all labs ordered are listed, but only abnormal results are displayed) Labs Reviewed  CBC WITH DIFFERENTIAL/PLATELET - Abnormal; Notable for the following components:      Result Value   Hemoglobin 12.6 (*)    HCT 38.6 (*)    All other components within normal limits  COMPREHENSIVE METABOLIC PANEL - Abnormal; Notable for the following components:   CO2 21 (*)    ALT 10 (*)    All  other components within normal limits  RAPID URINE DRUG SCREEN, HOSP PERFORMED - Abnormal; Notable for the following components:   Tetrahydrocannabinol POSITIVE (*)    All other components within normal limits  URINALYSIS, ROUTINE W REFLEX MICROSCOPIC - Abnormal; Notable for the following components:   Color, Urine STRAW (*)    Hgb urine dipstick MODERATE (*)    Squamous Epithelial / LPF 0-5 (*)    All other components within normal limits    EKG None  Radiology Ct Head Wo Contrast  Result Date: 01/03/2018 CLINICAL DATA:  22 y/o M; brief loss of consciousness with seizure-like activity. Complaint of dizziness. EXAM: CT HEAD WITHOUT CONTRAST TECHNIQUE: Contiguous axial images were obtained from the base of the skull through the vertex without intravenous contrast. COMPARISON:  None. FINDINGS: Brain: No evidence of acute infarction, hemorrhage, hydrocephalus, extra-axial collection or mass lesion/mass effect. Vascular: No hyperdense vessel or unexpected calcification. Skull: Normal. Negative for fracture or focal lesion. Sinuses/Orbits: No acute finding. Other: None. IMPRESSION: Negative CT of the head. Electronically Signed   By: Mitzi HansenLance  Furusawa-Stratton M.D.   On: 01/03/2018 17:39    Procedures Procedures (including critical care time)  Medications Ordered in ED Medications - No data to display   Initial Impression / Assessment and Plan / ED Course  I have reviewed the triage vital signs and the nursing notes.  Pertinent labs & imaging results that were available during my care of the patient were reviewed by me and considered in my medical decision making (see chart for details).     Pt slightly Orthostatic,  Pt admits to decreased fluid intake.  Pt has a normal EKg and a normal ct scan.   I am not sure if pt had a seizure.   Pt looks well over all.   I advised him to drink plenty of fluids.  I doubt cardiac event.  I advised him to follow up with primary care.   Final Clinical  Impressions(s) / ED Diagnoses   Final diagnoses:  Seizure-like activity [facility](HCC)    ED Discharge Orders    None    An After Visit Summary was printed and given to the patient.    Elson AreasSofia, Leslie K, [place] JerseyPA-C 01/03/18 1847    Mancel BaleWentz, Elliott, MD 01/05/18 2030

## 2018-01-03 NOTE — ED Notes (Signed)
Pt discharged from ED; instructions provided; Pt encouraged to return to ED if symptoms worsen and to f/u with PCP; Pt verbalized understanding of all instructions 

## 2018-01-06 ENCOUNTER — Encounter: Payer: Self-pay | Admitting: Neurology

## 2018-01-10 ENCOUNTER — Ambulatory Visit: Payer: BLUE CROSS/BLUE SHIELD | Admitting: Neurology

## 2018-01-10 ENCOUNTER — Encounter: Payer: Self-pay | Admitting: Neurology

## 2018-01-10 VITALS — BP 127/78 | HR 79 | Ht 72.0 in | Wt 179.0 lb

## 2018-01-10 DIAGNOSIS — F32A Depression, unspecified: Secondary | ICD-10-CM

## 2018-01-10 DIAGNOSIS — F329 Major depressive disorder, single episode, unspecified: Secondary | ICD-10-CM

## 2018-01-10 DIAGNOSIS — R569 Unspecified convulsions: Secondary | ICD-10-CM | POA: Diagnosis not present

## 2018-01-10 MED ORDER — LAMOTRIGINE 100 MG PO TABS: 100.0000 mg | ORAL_TABLET | Freq: Two times a day (BID) | ORAL | 11 refills | Status: DC

## 2018-01-10 MED ORDER — LAMOTRIGINE 25 MG PO TABS: 25.0000 mg | ORAL_TABLET | Freq: Two times a day (BID) | ORAL | 0 refills | Status: DC

## 2018-01-10 NOTE — Progress Notes (Signed)
PATIENT: Calvin [name] DOB: January 29, 1996  Chief Complaint  Patient presents with  . Possible seizure activity    On 01/03/18, reports he blacked out while at work. Says he was bending over and felt light headed prior to the event. He woke up on the ground and was told he passed out and had body jerking. He went to the ED for evaluation.      Marland Kitchen. PCP    No PCP established.  Referred by PCP.     HISTORICAL  Calvin [name] is a 22 years old male, seen in refer by the emergency room for evaluation of seizure-like event initial evaluation was on January 10, 2018.  I reviewed his emergency presentation on January 03, 2018, patient was at his job at [employer], around 2 PM, he felt lightheaded, then had tunnel vision, passed out on the ground, was told by the coworker, he had body jerking seizure-like event, he had loss of consciousness, had no recollection of the event, he woke up noticed he has thrown up, EMS was called, he was taken to the emergency room, CT head without contrast was normal.  UDS was positive for marijuana, CMP CBC showed no significant abnormality   This is his first similar episode, he denies lateral tongue biting, no urinary incontinence,  I reviewed multiple past, he had a history of ADHD, mood disorder, was under the care of psychiatrist, but has stopped the office visit, also stopped the medication,  "none of the medicine Did any thing bad or good to me"  Emergency presentation on April 03, 2017, suicidal ideation, with recent attempt, tried to hang himself with about, had bruise around his neck,  Hospital admission in October 2014, attempted to hang himsel seizure-like event on January 03, 2018 f  REVIEW OF SYSTEMS: Full 14 system review of systems performed and notable only for numbness, weakness, dizziness, passing out, joint pain, achy muscles  ALLERGIES: Allergies  Allergen Reactions  . Sulfa Antibiotics Rash    HOME MEDICATIONS: No current outpatient medications on  file.   No current facility-administered medications for this visit.     PAST MEDICAL HISTORY: Past Medical History:  Diagnosis Date  . ADHD (attention deficit hyperactivity disorder)   . Anxiety   . Bipolar disorder (HCC)     PAST SURGICAL HISTORY: Past Surgical History:  Procedure Laterality Date  . NO PAST SURGERIES      FAMILY HISTORY: Family History  Problem Relation Age of Onset  . Anxiety disorder Mother   . Personality disorder Mother   . Healthy Father   . Depression Sister     SOCIAL HISTORY:  Social History   Socioeconomic History  . Marital status: Single    Spouse name: Not on file  . Number of children: 0  . Years of education: 8712  . Highest education level: High school graduate  Occupational History  . Occupation: Landscaper  Social Needs  . Financial resource strain: Not on file  . Food insecurity:    Worry: Not on file    Inability: Not on file  . Transportation needs:    Medical: Not on file    Non-medical: Not on file  Tobacco Use  . Smoking status: Never Smoker  . Smokeless tobacco: Never Used  Substance and Sexual Activity  . Alcohol use: No  . Drug use: Yes    Types: Marijuana    Comment: every other week  . Sexual activity: Never    Birth control/protection:  Abstinence  Lifestyle  . Physical activity:    Days per week: Not on file    Minutes per session: Not on file  . Stress: Not on file  Relationships  . Social connections:    Talks on phone: Not on file    Gets together: Not on file    Attends religious service: Not on file    Active member of club or organization: Not on file    Attends meetings of clubs or organizations: Not on file    Relationship status: Not on file  . Intimate partner violence:    Fear of current or ex partner: Not on file    Emotionally abused: Not on file    Physically abused: Not on file    Forced sexual activity: Not on file  Other Topics Concern  . Not on file  Social History Narrative     Lives at home with two roommates.   Right-handed.   2 cups caffeine per day.     PHYSICAL EXAM   Vitals:   01/10/18 1021  BP: 127/78  Pulse: 79  Weight: 179 lb (81.2 kg)  Height: 6' (1.829 m)    Not recorded      Body mass index is 24.28 kg/m.  PHYSICAL EXAMNIATION:  Gen: NAD, conversant, well nourised, obese, well groomed                     Cardiovascular: Regular rate rhythm, no peripheral edema, warm, nontender. Eyes: Conjunctivae clear without exudates or hemorrhage Neck: Supple, no carotid bruits. Pulmonary: Clear to auscultation bilaterally   NEUROLOGICAL EXAM:  MENTAL STATUS: Speech:    Speech is normal; fluent and spontaneous with normal comprehension.  Cognition:     Orientation to time, place and person     Normal recent and remote memory     Normal Attention span and concentration     Normal Language, naming, repeating,spontaneous speech     Fund of knowledge   CRANIAL NERVES: CN II: Visual fields are full to confrontation. Fundoscopic exam is normal with sharp discs and no vascular changes. Pupils are round equal and briskly reactive to light. CN III, IV, VI: extraocular movement are normal. No ptosis. CN V: Facial sensation is intact to pinprick in all 3 divisions bilaterally. Corneal responses are intact.  CN VII: Face is symmetric with normal eye closure and smile. CN VIII: Hearing is normal to rubbing fingers CN IX, X: Palate elevates symmetrically. Phonation is normal. CN XI: Head turning and shoulder shrug are intact CN XII: Tongue is midline with normal movements and no atrophy.  MOTOR: There is no pronator drift of out-stretched arms. Muscle bulk and tone are normal. Muscle strength is normal.  REFLEXES: Reflexes are 2+ and symmetric at the biceps, triceps, knees, and ankles. Plantar responses are flexor.  SENSORY: Intact to light touch, pinprick, positional sensation and vibratory sensation are intact in fingers and  toes.  COORDINATION: Rapid alternating movements and fine finger movements are intact. There is no dysmetria on finger-to-nose and heel-knee-shin.    GAIT/STANCE: Posture is normal. Gait is steady with normal steps, base, arm swing, and turning. Heel and toe walking are normal. Tandem gait is normal.  Romberg is absent.   DIAGNOSTIC DATA (LABS, IMAGING, TESTING) - I reviewed patient records, labs, notes, testing and imaging myself where available.   ASSESSMENT AND PLAN  Calvin [name] is a 22 y.o. male    Seizure-like event on January 03, 2018 Mood  Disorder  Complete evaluation with MRI of the brain with and without contrast  EEG  Start lamotrigine 25 mg, titrating to 100 mg twice a day  No driving onto episode free for 6 months  Levert Feinstein, M.D. Ph.D.  [facility] [address] [place], [place] [address] Ph: [phone] Fax: [phone]  CC: Referring Provider

## 2018-01-10 NOTE — Patient Instructions (Addendum)
NO driving until episode from for 6 months.  1 tablet twice a day for the first week 2 tablets twice a day for the second week 3 tablets twice a day for the third week 4 tablets twice a day for the fourth week  For total of 140 tablets  After finish titration with small dose of lamotrigine 25 mg, change to lamotrigine 100 mg twice a day

## 2018-01-11 ENCOUNTER — Telehealth: Payer: Self-pay | Admitting: Neurology

## 2018-01-11 NOTE — Telephone Encounter (Signed)
[employer] Auth: [id number] (exp. 01/10/18 to 02/08/18) lvm for pt to call back about scheduling mri

## 2018-01-28 ENCOUNTER — Telehealth: Payer: Self-pay | Admitting: Neurology

## 2018-01-28 NOTE — Telephone Encounter (Signed)
I spoke with Calvin [name].  He works as a Administratorlandscaper.  Due to sz. like activity, Dr. Terrace ArabiaYan gave him a letter with certain work restrictions, specifically no driving, no climbing ladders, operating mechanical equipment that could put him in dangerous situations should he have sz. like activity while using it.  He requests a letter stating he is able to operate a weed eater and lawn mower.  He sts. he is not taking Lamictal b/c he "feels funny taking a medicine when I don't know what's wrong with me."  He has an appt. with Dr. Terrace ArabiaYan on 5/8, and I have explained that work restrictions will be addressed at pending appt.  He is agreeable

## 2018-01-28 NOTE — Telephone Encounter (Signed)
Pt has called stating that it has been almost 20 days(since 4-08) that he was seen and given a note for work placing him on restrictions.  Pt states he feels better and would like restrictions to be taken off so he can use equipment at work.  Please call

## 2018-01-31 ENCOUNTER — Encounter: Payer: Self-pay | Admitting: Neurology

## 2018-02-08 NOTE — Telephone Encounter (Signed)
Patient mother called me back and he is scheduled for 02/16/18 at [facility] mobile unit.

## 2018-02-08 NOTE — Telephone Encounter (Addendum)
Spoke to his mother: The patient reported not taking Lamictal.  He still needs to have his EEG, MRI and follow up with Dr. Terrace Arabia. These have not been scheduled due to failed attempts to contact him from our office.   We are unable to to alter his current restrictions at this time. Calvin [name] has tried to reach him to schedule his MRI.  He needs to follow up with Dr. Terrace Arabia for further evaluation after his testing.

## 2018-02-08 NOTE — Telephone Encounter (Signed)
Pts mother(Jodi) called requesting a call back to discuss why the pt had been prescribed Lamictal if he hadn't had a seizure and the doctor couldn't even say what was wrong. Also stating he is still not taking that medication and would like his restrictions cleared, since there hasnt been any sign of "siezure like activity". Please call to advise at [phone]

## 2018-02-08 NOTE — Telephone Encounter (Addendum)
Spoke to [place] - he is able to work patient in for EEG on 02/09/18. He will need to be at our office at 10am.  Irving Burton is happy to speak to his mother to schedule MRI.  I called his mother back and left a message to return my call.

## 2018-02-08 NOTE — Telephone Encounter (Signed)
I let a voicemail on his Mom Lennox Laity phone to call me back about scheduling MRI.

## 2018-02-09 ENCOUNTER — Ambulatory Visit: Payer: BLUE CROSS/BLUE SHIELD | Admitting: Neurology

## 2018-02-09 ENCOUNTER — Other Ambulatory Visit: Payer: BLUE CROSS/BLUE SHIELD

## 2018-02-09 ENCOUNTER — Encounter: Payer: Self-pay | Admitting: *Deleted

## 2018-02-09 DIAGNOSIS — R569 Unspecified convulsions: Secondary | ICD-10-CM | POA: Diagnosis not present

## 2018-02-16 ENCOUNTER — Ambulatory Visit: Payer: BLUE CROSS/BLUE SHIELD

## 2018-02-16 DIAGNOSIS — R569 Unspecified convulsions: Secondary | ICD-10-CM

## 2018-02-16 MED ORDER — GADOPENTETATE DIMEGLUMINE 469.01 MG/ML IV SOLN
15.0000 mL | Freq: Once | INTRAVENOUS | Status: AC | PRN
  Administered 2018-02-16: 15 mL via INTRAVENOUS

## 2018-02-18 ENCOUNTER — Telehealth: Payer: Self-pay | Admitting: *Deleted

## 2018-02-18 NOTE — Procedures (Signed)
   HISTORY: 22 years old male, with possible seizure activity  TECHNIQUE:  16 channel EEG was performed based on standard 10-16 international system. One channel was dedicated to EKG, which has demonstrates normal sinus rhythm of 66 beats per minutes.  Upon awakening, the posterior background activity was well-developed, in alpha range, laboratory reactive to eye opening and closure.  There was no evidence of epileptiform discharge.  Photic stimulation was performed, which induced a symmetric photic driving.  Hyperventilation was performed, there was no abnormality elicit.  Stage II sleep was achieved.  CONCLUSION: This is a  normal awake and sleep EEG.  There is no electrodiagnostic evidence of epileptiform discharge.  Levert Feinstein, M.D. Ph.D.  [facility] [address] [place], [place] [address] Phone: [phone] Fax:      [phone]

## 2018-02-18 NOTE — Telephone Encounter (Signed)
-----   Message from Levert Feinstein, MD sent at 02/18/2018 12:21 PM EDT ----- Please call pt for normal MRI brain.

## 2018-02-18 NOTE — Telephone Encounter (Signed)
Spoke to patient he is aware of results.

## 2018-02-23 ENCOUNTER — Ambulatory Visit: Payer: BLUE CROSS/BLUE SHIELD | Admitting: Neurology

## 2018-02-23 ENCOUNTER — Encounter: Payer: Self-pay | Admitting: Neurology

## 2018-02-23 VITALS — BP 115/68 | HR 58 | Ht 72.0 in | Wt 177.0 lb

## 2018-02-23 DIAGNOSIS — R569 Unspecified convulsions: Secondary | ICD-10-CM

## 2018-02-23 NOTE — Progress Notes (Signed)
PATIENT: Calvin [name] DOB: 01-10-1996  Chief Complaint  Patient presents with  . Seizure-like activity    He would like to review his EEG and MRI.  No further seizure-like activity.  He never started the Lamictal and does not wish to be on this medication.     HISTORICAL  Calvin [name] is a 22 years old male, seen in refer by the emergency room for evaluation of seizure-like event initial evaluation was on January 10, 2018.  I reviewed his emergency presentation on January 03, 2018, patient was at his job at [employer], around 2 PM, he felt lightheaded, then had tunnel vision, passed out on the ground, was told by the coworker, he had body jerking seizure-like event, he had loss of consciousness, had no recollection of the event, he woke up noticed he has thrown up, EMS was called, he was taken to the emergency room, CT head without contrast was normal.  UDS was positive for marijuana, CMP CBC showed no significant abnormality   This is his first similar episode, he denies lateral tongue biting, no urinary incontinence,  I reviewed multiple past, he had a history of ADHD, mood disorder, was under the care of psychiatrist, but has stopped the office visit, also stopped the medication,  "none of the medicine did any thing bad or good to me"  Emergency presentation on April 03, 2017, suicidal ideation, with recent attempt, tried to hang himself with about, had bruise around his neck,  Hospital admission in October 2014, attempted to hang himself    UPDATE Feb 23 2018: MRI brain w/wo on Feb 16 3018 and EEG were normal.  He had no recurrent seizure-like spells, he is not taking lamotrigine  REVIEW OF SYSTEMS: Full 14 system review of systems performed and notable only for as above  ALLERGIES: Allergies  Allergen Reactions  . Sulfa Antibiotics Rash    HOME MEDICATIONS: Current Outpatient Medications  Medication Sig Dispense Refill  . lamoTRIgine (LAMICTAL) 100 MG tablet Take 1 tablet  (100 mg total) by mouth 2 (two) times daily. 60 tablet 11  . lamoTRIgine (LAMICTAL) 25 MG tablet Take 1 tablet (25 mg total) by mouth 2 (two) times daily. titrating as instructed, one tab increment every week. 140 tablet 0   No current facility-administered medications for this visit.     PAST MEDICAL HISTORY: Past Medical History:  Diagnosis Date  . ADHD (attention deficit hyperactivity disorder)   . Anxiety   . Bipolar disorder (HCC)     PAST SURGICAL HISTORY: Past Surgical History:  Procedure Laterality Date  . NO PAST SURGERIES      FAMILY HISTORY: Family History  Problem Relation Age of Onset  . Anxiety disorder Mother   . Personality disorder Mother   . Healthy Father   . Depression Sister     SOCIAL HISTORY:  Social History   Socioeconomic History  . Marital status: Single    Spouse name: Not on file  . Number of children: 0  . Years of education: 83  . Highest education level: High school graduate  Occupational History  . Occupation: Landscaper  Social Needs  . Financial resource strain: Not on file  . Food insecurity:    Worry: Not on file    Inability: Not on file  . Transportation needs:    Medical: Not on file    Non-medical: Not on file  Tobacco Use  . Smoking status: Never Smoker  . Smokeless tobacco: Never Used  Substance and Sexual Activity  . Alcohol use: No  . Drug use: Yes    Types: Marijuana    Comment: every other week  . Sexual activity: Never    Birth control/protection: Abstinence  Lifestyle  . Physical activity:    Days per week: Not on file    Minutes per session: Not on file  . Stress: Not on file  Relationships  . Social connections:    Talks on phone: Not on file    Gets together: Not on file    Attends religious service: Not on file    Active member of club or organization: Not on file    Attends meetings of clubs or organizations: Not on file    Relationship status: Not on file  . Intimate partner violence:     Fear of current or ex partner: Not on file    Emotionally abused: Not on file    Physically abused: Not on file    Forced sexual activity: Not on file  Other Topics Concern  . Not on file  Social History Narrative   Lives at home with two roommates.   Right-handed.   2 cups caffeine per day.     PHYSICAL EXAM   Vitals:   02/23/18 1141  BP: 115/68  Pulse: (!) 58  Weight: 177 lb (80.3 kg)  Height: 6' (1.829 m)    Not recorded      Body mass index is 24.01 kg/m.  PHYSICAL EXAMNIATION:  Gen: NAD, conversant, well nourised, obese, well groomed                     Cardiovascular: Regular rate rhythm, no peripheral edema, warm, nontender. Eyes: Conjunctivae clear without exudates or hemorrhage Neck: Supple, no carotid bruits. Pulmonary: Clear to auscultation bilaterally   NEUROLOGICAL EXAM:  MENTAL STATUS: Speech:    Speech is normal; fluent and spontaneous with normal comprehension.  Cognition:     Orientation to time, place and person     Normal recent and remote memory     Normal Attention span and concentration     Normal Language, naming, repeating,spontaneous speech     Fund of Calvin   CRANIAL NERVES: CN II: Visual fields are full to confrontation. Fundoscopic exam is normal with sharp discs and no vascular changes. Pupils are round equal and briskly reactive to light. CN III, IV, VI: extraocular movement are normal. No ptosis. CN V: Facial sensation is intact to pinprick in all 3 divisions bilaterally. Corneal responses are intact.  CN VII: Face is symmetric with normal eye closure and smile. CN VIII: Hearing is normal to rubbing fingers CN IX, X: Palate elevates symmetrically. Phonation is normal. CN XI: Head turning and shoulder shrug are intact CN XII: Tongue is midline with normal movements and no atrophy.  MOTOR: There is no pronator drift of out-stretched arms. Muscle bulk and tone are normal. Muscle strength is normal.  REFLEXES: Reflexes are  2+ and symmetric at the biceps, triceps, knees, and ankles. Plantar responses are flexor.  SENSORY: Intact to light touch, pinprick, positional sensation and vibratory sensation are intact in fingers and toes.  COORDINATION: Rapid alternating movements and fine finger movements are intact. There is no dysmetria on finger-to-nose and heel-knee-shin.    GAIT/STANCE: Posture is normal. Gait is steady with normal steps, base, arm swing, and turning. Heel and toe walking are normal. Tandem gait is normal.  Romberg is absent.   DIAGNOSTIC DATA (LABS, IMAGING, TESTING) -  I reviewed patient records, labs, notes, testing and imaging myself where available.   ASSESSMENT AND PLAN  Calvin [name] is a 22 y.o. male    Seizure-like event on January 03, 2018 Mood Disorder  MRI of the brain with and without contrast is normal  EEG is normal   No driving onto episode free for 6 months  Call clinic for recurrent event only  Levert Feinstein, M.D. Ph.D.  [facility] [address], [address] [place], [place] [address] Ph: [phone] Fax: [phone]  CC: Referring Provider

## 2018-03-30 ENCOUNTER — Ambulatory Visit: Payer: Self-pay | Admitting: Neurology

## 2018-07-12 ENCOUNTER — Telehealth: Payer: Self-pay | Admitting: *Deleted

## 2018-07-12 ENCOUNTER — Ambulatory Visit: Payer: BLUE CROSS/BLUE SHIELD | Admitting: Neurology

## 2018-07-12 NOTE — Telephone Encounter (Signed)
No showed follow up appointment. 

## 2018-07-13 ENCOUNTER — Encounter: Payer: Self-pay | Admitting: Neurology

## 2019-08-01 ENCOUNTER — Other Ambulatory Visit: Payer: Self-pay

## 2019-08-01 DIAGNOSIS — Z20822 Contact with and (suspected) exposure to covid-19: Secondary | ICD-10-CM

## 2019-08-02 LAB — NOVEL CORONAVIRUS, NAA: SARS-CoV-2, NAA: NOT DETECTED

## 2020-04-24 ENCOUNTER — Other Ambulatory Visit: Payer: Self-pay | Admitting: Orthopedic Surgery

## 2020-04-29 ENCOUNTER — Encounter (HOSPITAL_BASED_OUTPATIENT_CLINIC_OR_DEPARTMENT_OTHER): Payer: Self-pay | Admitting: Orthopedic Surgery

## 2020-04-29 ENCOUNTER — Other Ambulatory Visit: Payer: Self-pay

## 2020-04-29 NOTE — Progress Notes (Signed)
Reviewed pt's neuro notes from 2019 with Dr Okey Dupre. No events or seizure like activity since. No clearance needed prior to surgery 05/06/20.

## 2020-05-02 ENCOUNTER — Other Ambulatory Visit (HOSPITAL_COMMUNITY)
Admission: RE | Admit: 2020-05-02 | Discharge: 2020-05-02 | Disposition: A | Discharge status: Home / Self Care | Payer: BC Managed Care – PPO | Source: Ambulatory Visit | Attending: Orthopedic Surgery | Admitting: Orthopedic Surgery

## 2020-05-02 DIAGNOSIS — Z01812 Encounter for preprocedural laboratory examination: Secondary | ICD-10-CM | POA: Diagnosis not present

## 2020-05-02 DIAGNOSIS — Z20822 Contact with and (suspected) exposure to covid-19: Secondary | ICD-10-CM | POA: Insufficient documentation

## 2020-05-02 LAB — SARS CORONAVIRUS 2 (TAT 6-24 HRS): SARS Coronavirus 2: NEGATIVE

## 2020-05-06 ENCOUNTER — Encounter (HOSPITAL_BASED_OUTPATIENT_CLINIC_OR_DEPARTMENT_OTHER): Payer: Self-pay | Admitting: Orthopedic Surgery

## 2020-05-06 ENCOUNTER — Ambulatory Visit (HOSPITAL_BASED_OUTPATIENT_CLINIC_OR_DEPARTMENT_OTHER): Payer: BC Managed Care – PPO | Admitting: Anesthesiology

## 2020-05-06 ENCOUNTER — Ambulatory Visit (HOSPITAL_COMMUNITY): Payer: BC Managed Care – PPO

## 2020-05-06 ENCOUNTER — Encounter (HOSPITAL_BASED_OUTPATIENT_CLINIC_OR_DEPARTMENT_OTHER)
Admission: RE | Disposition: A | Discharge status: Home / Self Care | Payer: Self-pay | Source: Home / Self Care | Attending: Orthopedic Surgery

## 2020-05-06 ENCOUNTER — Ambulatory Visit (HOSPITAL_BASED_OUTPATIENT_CLINIC_OR_DEPARTMENT_OTHER)
Admission: RE | Admit: 2020-05-06 | Discharge: 2020-05-06 | Disposition: A | Discharge status: Home / Self Care | Payer: BC Managed Care – PPO | Attending: Orthopedic Surgery | Admitting: Orthopedic Surgery

## 2020-05-06 ENCOUNTER — Other Ambulatory Visit: Payer: Self-pay

## 2020-05-06 DIAGNOSIS — F319 Bipolar disorder, unspecified: Secondary | ICD-10-CM | POA: Insufficient documentation

## 2020-05-06 DIAGNOSIS — Z4789 Encounter for other orthopedic aftercare: Secondary | ICD-10-CM

## 2020-05-06 DIAGNOSIS — S62002K Unspecified fracture of navicular [scaphoid] bone of left wrist, subsequent encounter for fracture with nonunion: Secondary | ICD-10-CM | POA: Diagnosis present

## 2020-05-06 DIAGNOSIS — Z882 Allergy status to sulfonamides status: Secondary | ICD-10-CM | POA: Insufficient documentation

## 2020-05-06 DIAGNOSIS — X58XXXD Exposure to other specified factors, subsequent encounter: Secondary | ICD-10-CM | POA: Diagnosis not present

## 2020-05-06 HISTORY — PX: OPEN REDUCTION INTERNAL FIXATION (ORIF) SCAPHOID WITH ILIAC CREST BONE GRAFT: SHX5668

## 2020-05-06 SURGERY — OPEN REDUCTION INTERNAL FIXATION (ORIF) SCAPHOID WITH ILIAC CREST BONE GRAFT
Anesthesia: General | Site: Wrist | Laterality: Left

## 2020-05-06 MED ORDER — MIDAZOLAM HCL 2 MG/2ML IJ SOLN
2.0000 mg | Freq: Once | INTRAMUSCULAR | Status: AC
  Administered 2020-05-06: 2 mg via INTRAVENOUS

## 2020-05-06 MED ORDER — BUPIVACAINE-EPINEPHRINE (PF) 0.5% -1:200000 IJ SOLN
INTRAMUSCULAR | Status: DC | PRN
Start: 2020-05-06 — End: 2020-05-06
  Administered 2020-05-06: 40 mL

## 2020-05-06 MED ORDER — LIDOCAINE 2% (20 MG/ML) 5 ML SYRINGE
INTRAMUSCULAR | Status: AC
  Filled 2020-05-06: qty 5

## 2020-05-06 MED ORDER — MIDAZOLAM HCL 2 MG/2ML IJ SOLN
INTRAMUSCULAR | Status: AC
  Filled 2020-05-06: qty 2

## 2020-05-06 MED ORDER — FENTANYL CITRATE (PF) 100 MCG/2ML IJ SOLN
INTRAMUSCULAR | Status: AC
  Filled 2020-05-06: qty 2

## 2020-05-06 MED ORDER — CLONIDINE HCL (ANALGESIA) 100 MCG/ML EP SOLN
EPIDURAL | Status: DC | PRN
  Administered 2020-05-06: 100 ug

## 2020-05-06 MED ORDER — MEPERIDINE HCL 25 MG/ML IJ SOLN: 6.2500 mg | INTRAMUSCULAR | Status: DC | PRN

## 2020-05-06 MED ORDER — LIDOCAINE HCL 1 % IJ SOLN
INTRAMUSCULAR | Status: DC | PRN
  Administered 2020-05-06: 100 mg via INTRADERMAL

## 2020-05-06 MED ORDER — DEXAMETHASONE SODIUM PHOSPHATE 10 MG/ML IJ SOLN
INTRAMUSCULAR | Status: DC | PRN
  Administered 2020-05-06: 10 mg via INTRAVENOUS

## 2020-05-06 MED ORDER — HYDROMORPHONE HCL 1 MG/ML IJ SOLN: 0.2500 mg | INTRAMUSCULAR | Status: DC | PRN

## 2020-05-06 MED ORDER — FENTANYL CITRATE (PF) 100 MCG/2ML IJ SOLN
100.0000 ug | Freq: Once | INTRAMUSCULAR | Status: AC
  Administered 2020-05-06: 100 ug via INTRAVENOUS

## 2020-05-06 MED ORDER — PROPOFOL 10 MG/ML IV BOLUS
INTRAVENOUS | Status: AC
  Filled 2020-05-06: qty 20

## 2020-05-06 MED ORDER — FENTANYL CITRATE (PF) 100 MCG/2ML IJ SOLN
INTRAMUSCULAR | Status: DC | PRN
  Administered 2020-05-06 (×2): 50 ug via INTRAVENOUS

## 2020-05-06 MED ORDER — ONDANSETRON HCL 4 MG/2ML IJ SOLN
INTRAMUSCULAR | Status: DC | PRN
  Administered 2020-05-06: 4 mg via INTRAVENOUS

## 2020-05-06 MED ORDER — PROPOFOL 10 MG/ML IV BOLUS
INTRAVENOUS | Status: DC | PRN
  Administered 2020-05-06: 200 mg via INTRAVENOUS

## 2020-05-06 MED ORDER — DEXAMETHASONE SODIUM PHOSPHATE 10 MG/ML IJ SOLN
INTRAMUSCULAR | Status: AC
  Filled 2020-05-06: qty 1

## 2020-05-06 MED ORDER — ACETAMINOPHEN 325 MG PO TABS
650.0000 mg | ORAL_TABLET | Freq: Four times a day (QID) | ORAL | 0 refills | Status: AC
Start: 2020-05-06 — End: 2020-05-26

## 2020-05-06 MED ORDER — CEFAZOLIN SODIUM-DEXTROSE 2-4 GM/100ML-% IV SOLN
INTRAVENOUS | Status: AC
  Filled 2020-05-06: qty 100

## 2020-05-06 MED ORDER — BUPIVACAINE HCL (PF) 0.5 % IJ SOLN
INTRAMUSCULAR | Status: AC
  Filled 2020-05-06: qty 30

## 2020-05-06 MED ORDER — OXYCODONE HCL 5 MG PO TABS: 5.0000 mg | ORAL_TABLET | Freq: Four times a day (QID) | ORAL | 0 refills | Status: AC | PRN

## 2020-05-06 MED ORDER — PROMETHAZINE HCL 25 MG/ML IJ SOLN
INTRAMUSCULAR | Status: AC
  Filled 2020-05-06: qty 1

## 2020-05-06 MED ORDER — DEXAMETHASONE SODIUM PHOSPHATE 4 MG/ML IJ SOLN
INTRAMUSCULAR | Status: DC | PRN
Start: 2020-05-06 — End: 2020-05-06
  Administered 2020-05-06: 8 mg via INTRAVENOUS

## 2020-05-06 MED ORDER — PROMETHAZINE HCL 25 MG/ML IJ SOLN
6.2500 mg | INTRAMUSCULAR | Status: DC | PRN
  Administered 2020-05-06: 6.25 mg via INTRAVENOUS

## 2020-05-06 MED ORDER — ONDANSETRON HCL 4 MG/2ML IJ SOLN
INTRAMUSCULAR | Status: AC
  Filled 2020-05-06: qty 2

## 2020-05-06 MED ORDER — CEFAZOLIN SODIUM-DEXTROSE 2-4 GM/100ML-% IV SOLN
2.0000 g | INTRAVENOUS | Status: AC
  Administered 2020-05-06: 2 g via INTRAVENOUS

## 2020-05-06 MED ORDER — BUPIVACAINE-EPINEPHRINE (PF) 0.25% -1:200000 IJ SOLN
INTRAMUSCULAR | Status: DC | PRN
  Administered 2020-05-06: 10 mL

## 2020-05-06 MED ORDER — LACTATED RINGERS IV SOLN: INTRAVENOUS | Status: DC

## 2020-05-06 SURGICAL SUPPLY — 76 items
APL PRP STRL LF DISP 70% ISPRP (MISCELLANEOUS) ×1
BAND INSRT 18 STRL LF DISP RB (MISCELLANEOUS)
BAND RUBBER #18 3X1/16 STRL (MISCELLANEOUS) IMPLANT
BIT DRILL 1.8 CANN MAX VPC (BIT) ×2 IMPLANT
BLADE MINI RND TIP GREEN BEAV (BLADE) IMPLANT
BLADE SURG 15 STRL LF DISP TIS (BLADE) ×1 IMPLANT
BLADE SURG 15 STRL SS (BLADE) ×3
BNDG CMPR 9X4 STRL LF SNTH (GAUZE/BANDAGES/DRESSINGS) ×1
BNDG COHESIVE 2X5 TAN STRL LF (GAUZE/BANDAGES/DRESSINGS) IMPLANT
BNDG COHESIVE 4X5 TAN STRL (GAUZE/BANDAGES/DRESSINGS) ×3 IMPLANT
BNDG ESMARK 4X9 LF (GAUZE/BANDAGES/DRESSINGS) ×3 IMPLANT
BNDG GAUZE ELAST 4 BULKY (GAUZE/BANDAGES/DRESSINGS) ×3 IMPLANT
BRUSH SCRUB EZ PLAIN DRY (MISCELLANEOUS) IMPLANT
CANISTER SUCT 1200ML W/VALVE (MISCELLANEOUS) ×3 IMPLANT
CHLORAPREP W/TINT 26 (MISCELLANEOUS) ×3 IMPLANT
CORD BIPOLAR FORCEPS 12FT (ELECTRODE) ×3 IMPLANT
COVER BACK TABLE 60X90IN (DRAPES) ×3 IMPLANT
COVER MAYO STAND STRL (DRAPES) ×3 IMPLANT
COVER WAND RF STERILE (DRAPES) IMPLANT
CUFF TOURN SGL QUICK 18X4 (TOURNIQUET CUFF) ×2 IMPLANT
CUFF TOURN SGL QUICK 24 (TOURNIQUET CUFF)
CUFF TRNQT CYL 24X4X16.5-23 (TOURNIQUET CUFF) IMPLANT
DRAPE C-ARM 42X72 X-RAY (DRAPES) ×3 IMPLANT
DRAPE EXTREMITY T 121X128X90 (DISPOSABLE) ×3 IMPLANT
DRAPE INCISE IOBAN 66X45 STRL (DRAPES) ×3 IMPLANT
DRAPE SURG 17X23 STRL (DRAPES) ×3 IMPLANT
DRSG ADAPTIC 3X8 NADH LF (GAUZE/BANDAGES/DRESSINGS) ×3 IMPLANT
DRSG EMULSION OIL 3X3 NADH (GAUZE/BANDAGES/DRESSINGS) IMPLANT
DRSG MEPILEX BORDER 4X4 (GAUZE/BANDAGES/DRESSINGS) ×2 IMPLANT
ELECT REM PT RETURN 9FT ADLT (ELECTROSURGICAL) ×3
ELECTRODE REM PT RTRN 9FT ADLT (ELECTROSURGICAL) ×1 IMPLANT
GAUZE SPONGE 4X4 12PLY STRL LF (GAUZE/BANDAGES/DRESSINGS) ×3 IMPLANT
GLOVE BIO SURGEON STRL SZ7.5 (GLOVE) ×3 IMPLANT
GLOVE BIOGEL PI IND STRL 7.0 (GLOVE) ×1 IMPLANT
GLOVE BIOGEL PI IND STRL 8 (GLOVE) ×1 IMPLANT
GLOVE BIOGEL PI INDICATOR 7.0 (GLOVE) ×6
GLOVE BIOGEL PI INDICATOR 8 (GLOVE) ×2
GLOVE ECLIPSE 6.5 STRL STRAW (GLOVE) ×6 IMPLANT
GOWN STRL REUS W/TWL XL LVL3 (GOWN DISPOSABLE) ×3 IMPLANT
K-WIRE COCR 0.9X95 (WIRE) ×3
KWIRE COCR 0.9X95 (WIRE) ×1 IMPLANT
NDL 16GX1 1/2 (NEEDLE) IMPLANT
NDL HYPO 25X1 1.5 SAFETY (NEEDLE) IMPLANT
NEEDLE 16GX1 1/2 (NEEDLE) IMPLANT
NEEDLE HYPO 25X1 1.5 SAFETY (NEEDLE) IMPLANT
NS IRRIG 1000ML POUR BTL (IV SOLUTION) ×3 IMPLANT
PACK BASIN DAY SURGERY FS (CUSTOM PROCEDURE TRAY) ×3 IMPLANT
PADDING CAST ABS 4INX4YD NS (CAST SUPPLIES)
PADDING CAST ABS COTTON 4X4 ST (CAST SUPPLIES) IMPLANT
PENCIL SMOKE EVACUATOR (MISCELLANEOUS) ×3 IMPLANT
SCREW COMP HEADLESS 2.5X24 (Screw) ×2 IMPLANT
SLEEVE SCD COMPRESS KNEE MED (MISCELLANEOUS) ×3 IMPLANT
SPLINT FAST PLASTER 5X30 (CAST SUPPLIES) ×2
SPLINT FIBERGLASS 3X35 (CAST SUPPLIES) ×2 IMPLANT
SPLINT PLASTER CAST FAST 5X30 (CAST SUPPLIES) ×1 IMPLANT
SPONGE LAP 4X18 RFD (DISPOSABLE) ×3 IMPLANT
SPONGE SURGIFOAM ABS GEL 12-7 (HEMOSTASIS) ×2 IMPLANT
STOCKINETTE 6  STRL (DRAPES) ×3
STOCKINETTE 6 STRL (DRAPES) ×1 IMPLANT
SUCTION FRAZIER HANDLE 10FR (MISCELLANEOUS) ×3
SUCTION TUBE FRAZIER 10FR DISP (MISCELLANEOUS) ×1 IMPLANT
SUT VIC AB 0 CT1 27 (SUTURE)
SUT VIC AB 0 CT1 27XBRD ANBCTR (SUTURE) IMPLANT
SUT VIC AB 2-0 CT3 27 (SUTURE) ×3 IMPLANT
SUT VIC AB 2-0 SH 27 (SUTURE)
SUT VIC AB 2-0 SH 27XBRD (SUTURE) IMPLANT
SUT VIC AB 3-0 FS2 27 (SUTURE) ×3 IMPLANT
SUT VICRYL 4-0 PS2 18IN ABS (SUTURE) IMPLANT
SUT VICRYL RAPIDE 4-0 (SUTURE) ×3 IMPLANT
SUT VICRYL RAPIDE 4/0 PS 2 (SUTURE) ×3 IMPLANT
SYR 10ML LL (SYRINGE) ×2 IMPLANT
SYR BULB EAR ULCER 3OZ GRN STR (SYRINGE) ×3 IMPLANT
TOWEL GREEN STERILE FF (TOWEL DISPOSABLE) ×3 IMPLANT
TUBE CONNECTING 20'X1/4 (TUBING) ×1
TUBE CONNECTING 20X1/4 (TUBING) ×2 IMPLANT
UNDERPAD 30X36 HEAVY ABSORB (UNDERPADS AND DIAPERS) ×3 IMPLANT

## 2020-05-06 NOTE — Interval H&P Note (Signed)
History and Physical Interval Note:  05/06/2020 8:21 AM  Calvin [name]  has presented today for surgery, with the diagnosis of LEFT SCAPHOID NONUNION.  The various methods of treatment have been discussed with the patient and family. After consideration of risks, benefits and other options for treatment, the patient has consented to  Procedure(s) with comments: OPEN TREATMENT OF LEFT SCAPHOID NONUNION WITH ILIAC CREST BONE GRAFT (Left) - LENGTH OF SURGERY: 3.5 HOURS as a surgical intervention.  The patient's history has been reviewed, patient examined, no change in status, stable for surgery.  I have reviewed the patient's chart and labs.  Questions were answered to the patient's satisfaction.     Jodi Marble

## 2020-05-06 NOTE — Op Note (Signed)
05/06/2020  8:21 AM  PATIENT:  Calvin [name]  24 y.o. male  PRE-OPERATIVE DIAGNOSIS:  Left scaphoid nonunion  POST-OPERATIVE DIAGNOSIS:  Same  PROCEDURE:  Open treatment of left scaphoid nonunion with ICBG & closure of ICBG harvest site, with radial styloidectomy  SURGEON: Cliffton Asters. Janee Morn, MD  PHYSICIAN ASSISTANT: Danielle Rankin, OPA-C  ANESTHESIA:  regional and general  SPECIMENS:  None  DRAINS:   None  EBL:  less than 50 mL  PREOPERATIVE INDICATIONS:  Calvin [name] is a  24 y.o. male with a left scaphoid nonunion.  MRI evaluation suggests prox fragment without AVN, but with some DISI deformity present  The risks benefits and alternatives were discussed with the patient preoperatively including but not limited to the risks of infection, bleeding, nerve injury, cardiopulmonary complications, the need for revision surgery, among others, and the patient verbalized understanding and consented to proceed.  OPERATIVE IMPLANTS: Biomet-Zimmer 2.56mm VPC screw, 65mm long  OPERATIVE PROCEDURE:  After receiving prophylactic antibiotics and a regional block, the patient was escorted to the operative theatre and placed in a supine position.  General anesthesia was administered.  A surgical "time-out" was performed during which the planned procedure, proposed operative site, and the correct patient identity were compared to the operative consent and agreement confirmed by the circulating nurse according to current facility policy.  Following application of a tourniquet to the operative extremity, the exposed skin was prepped with Chloraprep and draped in the usual sterile fashion.  The ICBG site was similarly prepped and draped. The limb was exsanguinated with an Esmarch bandage and the tourniquet inflated to approximately higher than systolic BP.  The scaphoid was exposed first, by creating a hockey stick shaped incision, hugging the radial border of the FCR therefore coursing  obliquely in line with the thumb ray.  The wrist capsule was divided in line with the scaphoid and reflected.  The scaphoid nonunion was identified.  Once the humpback deformity was corrected, there was a large cavitary lesion filled with fibrous tissue.  The proximal pole was first stabilized and its orientation corrected by flexing the wrist, bringing the lunate out of extension into neutral a very slight palmar flexion and pinning at their to the radius advancing the pins on the radial aspect of the radial metaphysis and into the lunate.  With the proximal pole thus stabilized, the nonunion site was debrided with a combination of curettes and a ronjure.  A conservative radial styloidectomy was also performed.  Once satisfied with the debridement, the margins of the cavitary lesions were scored with the spinning tip of a 0.062 inch K wire.  This was irrigated, tourniquet released, and a moist dressing placed within it.  Attention was directed to the harvesting of the iliac crest bone graft.  An incision was made parallel to the anterior iliac crest, but distal to the crest, after anesthetizing it with Marcaine Baring epinephrine..  The skin was then pulled superiorly and allow for Bovie dissection down to the top surface of the crest.  The fascia was divided and the top of the crest freed of soft tissue attachment.  With the top of the crest exposed, as well as the upper portions of the inner and outer table, an appropriately sized graft was harvested using osteotomes.  Once the graft was obtained in this manner, the wound was irrigated, Gelfoam placed, and the fascia and periosteum on the crest reapproximated with running 2-0 Vicryl suture.  The wound was irrigated and the  skin was closed with 3-0 Vicryl deep dermal interrupted subcuticular sutures followed by a running subcuticular 4-0 Vicryl Rapide.  Attention was shifted back to the scaphoid.  The site of the nonunion was again irrigated.  Cancellous bone  was finally morselized with a rondure and packed into the concavities of both the proximal and the distal pole.  The major cortico-cancellous graft was then shaped to fit and placed with the cancellous surface deep, with the cortical surface essentially recreating the palmar cortex of the scaphoid.  The graft had good fit and was fairly stable.  Nonetheless, a 2.4 mm VPC screw was placed from distal to proximal, further securing the graft and compressing the nonunion site.  The previously placed K wire that is stabilized the lunate was then removed, and final images obtained.  Overall length and morphology of the scaphoid seemed acceptable.  The wound was irrigated and the volar wrist capsule closed with 2-0 Vicryl interrupted suture.  Again the wound is irrigated and the skin reapproximated with 4-0 Vicryl Rapide horizontal mattress sutures.  A sugar tong thumb spica splint dressing was applied to the arm and a Mepilex to the iliac crest harvest site, and he was taken to the recovery room in stable condition.  DISPOSITION: He will be discharged home with typical instructions, returning in 10 to 15 days, at that visit removing his postoperative splint dressing, obtaining new left wrist x-rays to include a scaphoid view, and applying a long-arm thumb spica cast with a cut out for the bone stimulator.

## 2020-05-06 NOTE — Progress Notes (Signed)
Assisted Dr. Germeroth with left, ultrasound guided, axillary block. Side rails up, monitors on throughout procedure. See vital signs in flow sheet. Tolerated Procedure well. 

## 2020-05-06 NOTE — Anesthesia Postprocedure Evaluation (Signed)
Anesthesia Post Note  Patient: Calvin [name]  Procedure(s) Performed: OPEN TREATMENT OF LEFT SCAPHOID NONUNION WITH ILIAC CREST BONE GRAFT (Left Wrist)     Patient location during evaluation: PACU Anesthesia Type: General Level of consciousness: sedated and patient cooperative Pain management: pain level controlled Vital Signs Assessment: post-procedure vital signs reviewed and stable Respiratory status: spontaneous breathing Cardiovascular status: stable Anesthetic complications: no   No complications documented.  Last Vitals:  Vitals:   05/06/20 1210 05/06/20 1215  BP:  (!) 97/54  Pulse: (!) 56 (!) 55  Resp: 14 14  Temp:  36.5 C  SpO2: 97% 98%    Last Pain:  Vitals:   05/06/20 1215  TempSrc:   PainSc: 0-No pain                 Lewie Loron

## 2020-05-06 NOTE — Anesthesia Procedure Notes (Signed)
Procedure Name: LMA Insertion Date/Time: 05/06/2020 8:39 AM Performed by: Thornell Mule, CRNA Pre-anesthesia Checklist: Patient identified, Emergency Drugs available, Suction available and Patient being monitored Patient Re-evaluated:Patient Re-evaluated prior to induction Oxygen Delivery Method: Circle system utilized Preoxygenation: Pre-oxygenation with 100% oxygen Induction Type: IV induction LMA: LMA inserted LMA Size: 4.0 Number of attempts: 1 Placement Confirmation: positive ETCO2 Tube secured with: Tape Dental Injury: Teeth and Oropharynx as per pre-operative assessment

## 2020-05-06 NOTE — Anesthesia Procedure Notes (Signed)
Anesthesia Regional Block: Axillary brachial plexus block   Pre-Anesthetic Checklist: ,, timeout performed, Correct Patient, Correct Site, Correct Laterality, Correct Procedure, Correct Position, site marked, Risks and benefits discussed,  Surgical consent,  Pre-op evaluation,  At surgeon's request and post-op pain management  Laterality: Left  Prep: chloraprep       Needles:  Injection technique: Single-shot  Needle Type: Stimiplex     Needle Length: 4cm  Needle Gauge: 22     Additional Needles:   Procedures:,,,, ultrasound used (permanent image in chart),,,,  Narrative:  Start time: 05/06/2020 8:00 AM End time: 05/06/2020 8:06 AM Injection made incrementally with aspirations every 5 mL.  Performed by: Personally   Additional Notes: Tolerated well. Good blockade/anesthesia.

## 2020-05-06 NOTE — H&P (Signed)
Calvin [name] is an 24 y.o. male.   Chief Complaint: left scaphoid nonunion HPI: established left scaphoid nonunion, with pain and stiffness, several months since original injury  Past Medical History:  Diagnosis Date   ADHD (attention deficit hyperactivity disorder)    Anxiety    Bipolar disorder (HCC)     Past Surgical History:  Procedure Laterality Date   WISDOM TOOTH EXTRACTION      Family History  Problem Relation Age of Onset   Anxiety disorder Mother    Personality disorder Mother    Healthy Father    Depression Sister    Social History:  reports that he has never smoked. He has never used smokeless tobacco. He reports current drug use. Drug: Marijuana. He reports that he does not drink alcohol.  Allergies:  Allergies  Allergen Reactions   Sulfa Antibiotics Rash    No medications prior to admission.    No results found for this or any previous visit (from the past 48 hour(s)). No results found.  Review of Systems  All other systems reviewed and are negative.   Blood pressure (!) 114/58, pulse 72, temperature 97.7 F (36.5 C), temperature source Oral, resp. rate 15, height 6' (1.829 m), weight 80.2 kg, SpO2 100 %. Physical Exam Vitals reviewed.  Constitutional:      Appearance: Normal appearance. He is normal weight.  HENT:     Head: Normocephalic.  Eyes:     Extraocular Movements: Extraocular movements intact.  Cardiovascular:     Pulses: Normal pulses.  Pulmonary:     Effort: Pulmonary effort is normal.  Abdominal:     General: Abdomen is flat.  Musculoskeletal:     Comments: Left wrist TTP in snuffbox, with mild decreased wrist ROM  Skin:    General: Skin is warm and dry.     Capillary Refill: Capillary refill takes less than 2 seconds.  Neurological:     General: No focal deficit present.     Mental Status: He is alert and oriented to person, place, and time.  Psychiatric:        Mood and Affect: Mood normal.        Behavior:  Behavior normal.      Assessment/Plan Left scaphoid nonunion--To OR for operative treatment, including ICBG.  G/R/O reviewed again and consent obtained.  Jodi Marble, MD 05/06/2020, 8:16 AM

## 2020-05-06 NOTE — Anesthesia Preprocedure Evaluation (Addendum)
Anesthesia Evaluation  Patient identified by MRN, date of birth, ID band Patient awake    Reviewed: Allergy & Precautions, NPO status , Patient's Chart, lab work & pertinent test results  Airway Mallampati: II  TM Distance: >3 FB Neck ROM: Full    Dental no notable dental hx. (+) Dental Advisory Given, Teeth Intact   Pulmonary neg pulmonary ROS,    Pulmonary exam normal breath sounds clear to auscultation       Cardiovascular negative cardio ROS Normal cardiovascular exam Rhythm:Regular Rate:Normal     Neuro/Psych Seizures -,  PSYCHIATRIC DISORDERS Anxiety Depression Bipolar Disorder    GI/Hepatic negative GI ROS, Neg liver ROS,   Endo/Other  negative endocrine ROS  Renal/GU negative Renal ROS     Musculoskeletal negative musculoskeletal ROS (+)   Abdominal   Peds  Hematology negative hematology ROS (+)   Anesthesia Other Findings   Reproductive/Obstetrics                           Anesthesia Physical Anesthesia Plan  ASA: II  Anesthesia Plan: General   Post-op Pain Management: GA combined w/ Regional for post-op pain   Induction: Intravenous  PONV Risk Score and Plan: 3 and Ondansetron, Midazolam and Treatment may vary due to age or medical condition  Airway Management Planned: Oral ETT  Additional Equipment:   Intra-op Plan:   Post-operative Plan: Extubation in OR  Informed Consent: I have reviewed the patients History and Physical, chart, labs and discussed the procedure including the risks, benefits and alternatives for the proposed anesthesia with the patient or authorized representative who has indicated his/her understanding and acceptance.     Dental advisory given  Plan Discussed with: CRNA  Anesthesia Plan Comments:        Anesthesia Quick Evaluation

## 2020-05-06 NOTE — Discharge Instructions (Addendum)
Discharge Instructions   You have a dressing with a plaster splint incorporated in it. Move your fingers as much as possible, making a full fist and fully opening the fist. Elevate your hand to reduce pain & swelling of the digits.  Ice over the operative site may be helpful to reduce pain & swelling.  DO NOT USE HEAT. Pain medicine has been prescribed for you.  Take Tylenol 650 mg every 6 hours. Avoid NSAIDS such as Ibuprofen or Aleve. Take the pain medicine prescribed as a rescue medicine for severe post surgical pain. Leave the dressing in place until you return to our office.  You may shower, but keep the bandage clean & dry.  You may drive a car when you are off of prescription pain medications and can safely control your vehicle with both hands. Our office will contact you to arrange a follow up appointment.   Please call [phone] during normal business hours or [phone] after hours for any problems. Including the following:  - excessive redness of the incisions - drainage for more than 4 days - fever of more than 101.5 F  *Please note that pain medications will not be refilled after hours or on weekends.  Work Status: Keep bandages clean and dry. May return to work on 05-13-20, but NO WORK AT ALL WITH LEFT HAND/ARM  Post Anesthesia Home Care Instructions  Activity: Get plenty of rest for the remainder of the day. A responsible individual must stay with you for 24 hours following the procedure.  For the next 24 hours, DO NOT: -Drive a car -Advertising copywriter -Drink alcoholic beverages -Take any medication unless instructed by your physician -Make any legal decisions or sign important papers.  Meals: Start with liquid foods such as gelatin or soup. Progress to regular foods as tolerated. Avoid greasy, spicy, heavy foods. If nausea and/or vomiting occur, drink only clear liquids until the nausea and/or vomiting subsides. Call your physician if vomiting  continues.  Special Instructions/Symptoms: Your throat may feel dry or sore from the anesthesia or the breathing tube placed in your throat during surgery. If this causes discomfort, gargle with warm salt water. The discomfort should disappear within 24 hours.  If you had a scopolamine patch placed behind your ear for the management of post- operative nausea and/or vomiting:  1. The medication in the patch is effective for 72 hours, after which it should be removed.  Wrap patch in a tissue and discard in the trash. Wash hands thoroughly with soap and water. 2. You may remove the patch earlier than 72 hours if you experience unpleasant side effects which may include dry mouth, dizziness or visual disturbances. 3. Avoid touching the patch. Wash your hands with soap and water after contact with the patch.    Regional Anesthesia Blocks  1. Numbness or the inability to move the "blocked" extremity may last from 3-48 hours after placement. The length of time depends on the medication injected and your individual response to the medication. If the numbness is not going away after 48 hours, call your surgeon.  2. The extremity that is blocked will need to be protected until the numbness is gone and the  Strength has returned. Because you cannot feel it, you will need to take extra care to avoid injury. Because it may be weak, you may have difficulty moving it or using it. You may not know what position it is in without looking at it while the block is in effect.  3.  For blocks in the legs and feet, returning to weight bearing and walking needs to be done carefully. You will need to wait until the numbness is entirely gone and the strength has returned. You should be able to move your leg and foot normally before you try and bear weight or walk. You will need someone to be with you when you first try to ensure you do not fall and possibly risk injury.  4. Bruising and tenderness at the needle site are  common side effects and will resolve in a few days.  5. Persistent numbness or new problems with movement should be communicated to the surgeon or the [facility] [phone] [facility] [phone]).

## 2020-05-06 NOTE — Transfer of Care (Signed)
Immediate Anesthesia Transfer of Care Note  Patient: Calvin [name]  Procedure(s) Performed: OPEN TREATMENT OF LEFT SCAPHOID NONUNION WITH ILIAC CREST BONE GRAFT (Left Wrist)  Patient Location: PACU  Anesthesia Type:General  Level of Consciousness: drowsy, patient cooperative and responds to stimulation  Airway & Oxygen Therapy: Patient Spontanous Breathing and Patient connected to face mask oxygen  Post-op Assessment: Report given to RN and Post -op Vital signs reviewed and stable  Post vital signs: Reviewed and stable  Last Vitals:  Vitals Value Taken Time  BP    Temp    Pulse    Resp    SpO2      Last Pain:  Vitals:   05/06/20 0641  TempSrc: Oral  PainSc: 2       Patients Stated Pain Goal: 2 (05/06/20 0641)  Complications: No complications documented.

## 2020-05-07 ENCOUNTER — Encounter (HOSPITAL_BASED_OUTPATIENT_CLINIC_OR_DEPARTMENT_OTHER): Payer: Self-pay | Admitting: Orthopedic Surgery

## 2020-07-02 ENCOUNTER — Other Ambulatory Visit: Payer: Self-pay | Admitting: Orthopedic Surgery

## 2020-07-02 DIAGNOSIS — M25532 Pain in left wrist: Secondary | ICD-10-CM

## 2020-07-10 ENCOUNTER — Ambulatory Visit
Admission: RE | Admit: 2020-07-10 | Discharge: 2020-07-10 | Disposition: A | Discharge status: Home / Self Care | Payer: BC Managed Care – PPO | Source: Ambulatory Visit | Attending: Orthopedic Surgery | Admitting: Orthopedic Surgery

## 2020-07-10 DIAGNOSIS — M25532 Pain in left wrist: Secondary | ICD-10-CM

## 2020-08-27 ENCOUNTER — Other Ambulatory Visit: Payer: Self-pay | Admitting: Orthopedic Surgery

## 2020-08-27 DIAGNOSIS — M25532 Pain in left wrist: Secondary | ICD-10-CM

## 2020-09-17 ENCOUNTER — Ambulatory Visit
Admission: RE | Admit: 2020-09-17 | Discharge: 2020-09-17 | Disposition: A | Discharge status: Home / Self Care | Payer: BC Managed Care – PPO | Source: Ambulatory Visit | Attending: Orthopedic Surgery | Admitting: Orthopedic Surgery

## 2020-09-17 DIAGNOSIS — M25532 Pain in left wrist: Secondary | ICD-10-CM

## 2022-04-18 IMAGING — RF DG WRIST COMPLETE 3+V*L*
1 series · 3 of 3 positions shown · non-contrast
Comparison: 03/28/2020

CLINICAL DATA: Scaphoid nonunion

EXAM:
LEFT WRIST - COMPLETE 3+ VIEW; DG C-ARM 1-60 MIN

[Series 1: run · 3 of 3 slices shown]
[im 1/3]
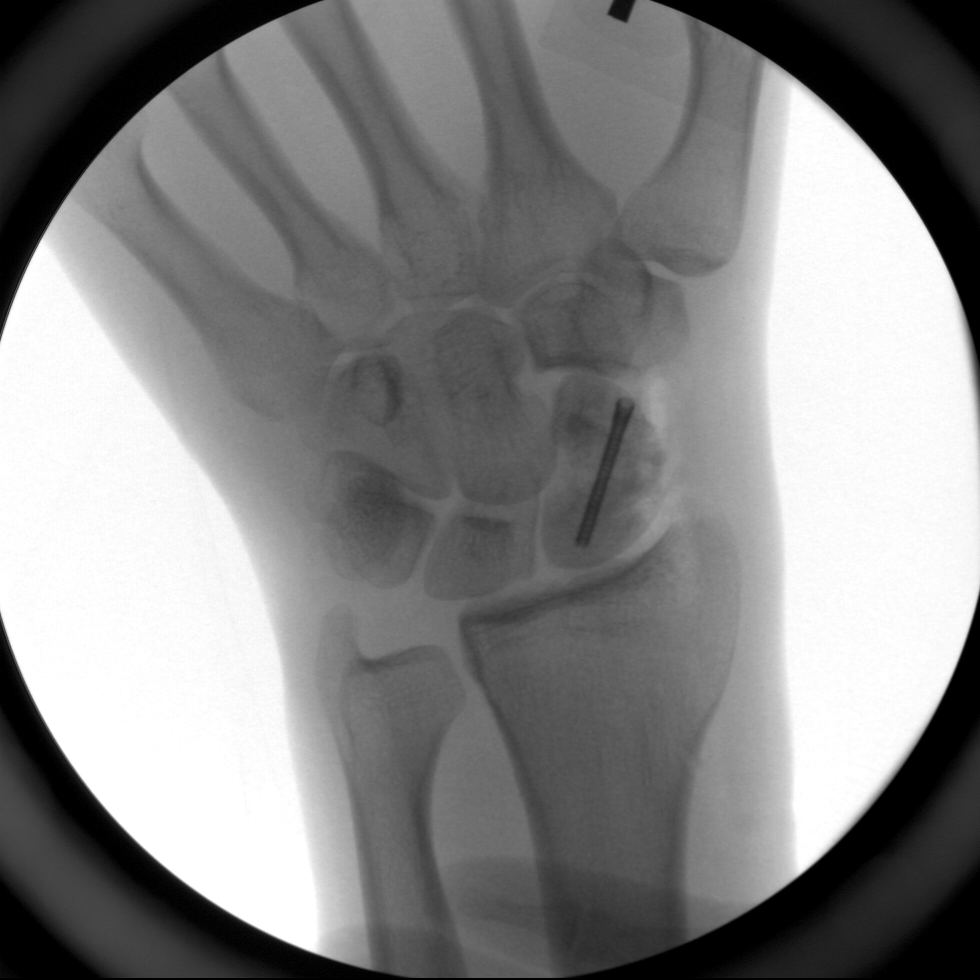
[im 2/3]
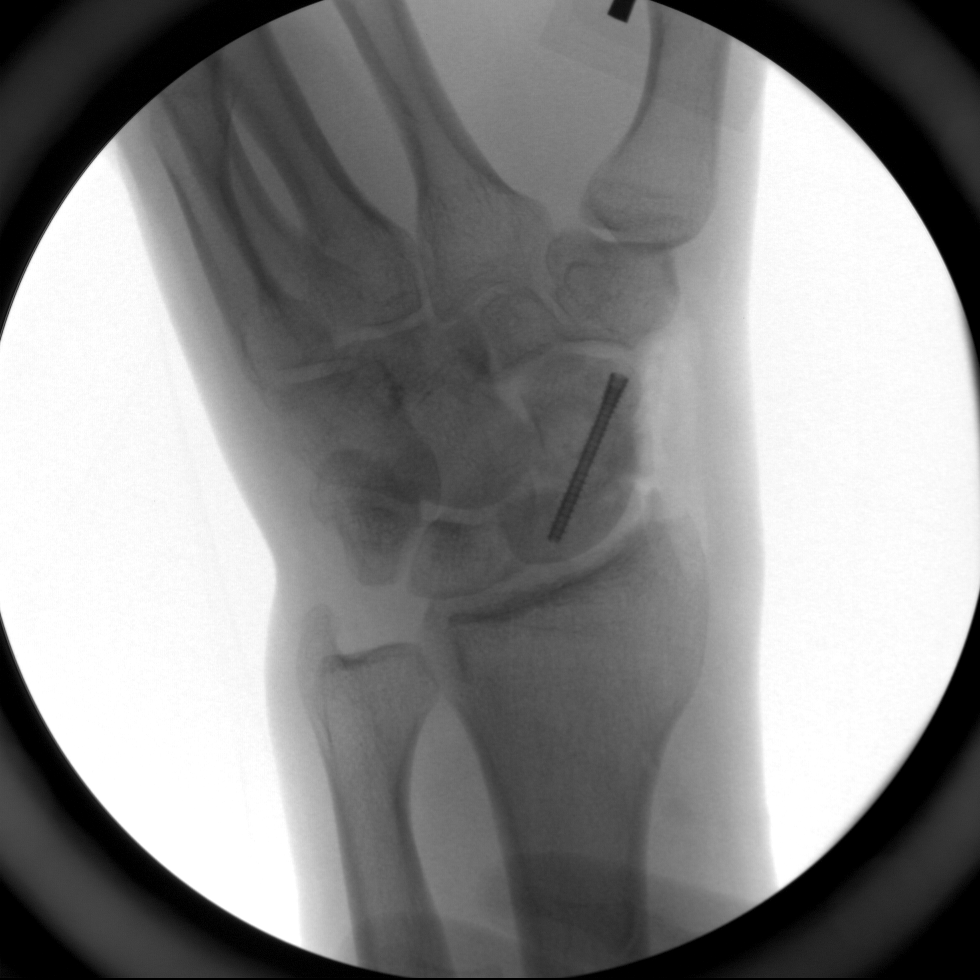
[im 3/3]
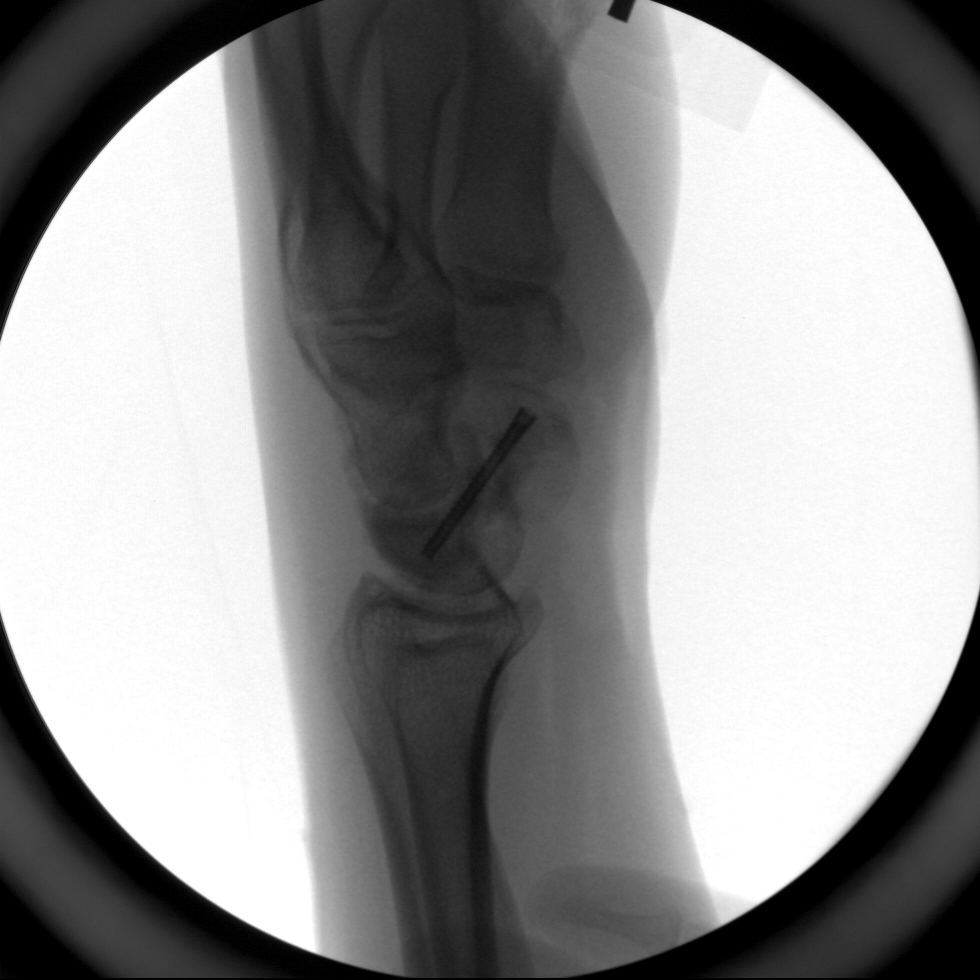

[3 of 3 positions shown; findings below may reference images not displayed]

FLUOROSCOPY TIME:  Radiation Exposure Index (as provided by the
fluoroscopic device): 2.22 mGy

If the device does not provide the exposure index:

Fluoroscopy Time:  48 seconds

Number of Acquired Images:  3
FINDINGS: Fixation screw is noted traversing the scaphoid. Fracture fragments
are in near anatomic alignment. No soft tissue abnormality is noted.
IMPRESSION: ORIF of prior scaphoid fracture with nonunion.

## 2022-04-18 IMAGING — RF DG C-ARM 1-60 MIN
1 series · 3 of 3 positions shown · non-contrast
Comparison: 03/28/2020

CLINICAL DATA: Scaphoid nonunion

EXAM:
LEFT WRIST - COMPLETE 3+ VIEW; DG C-ARM 1-60 MIN

[Series 1: run · 3 of 3 slices shown]
[im 1/3]
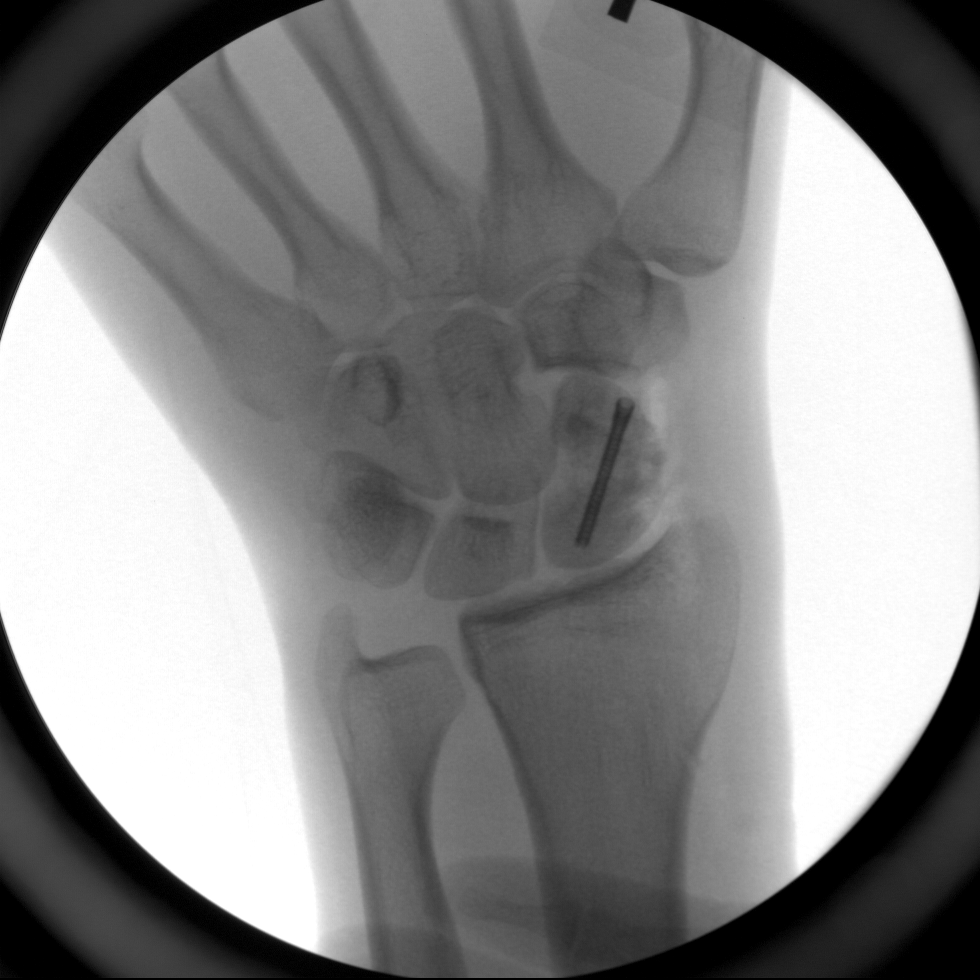
[im 2/3]
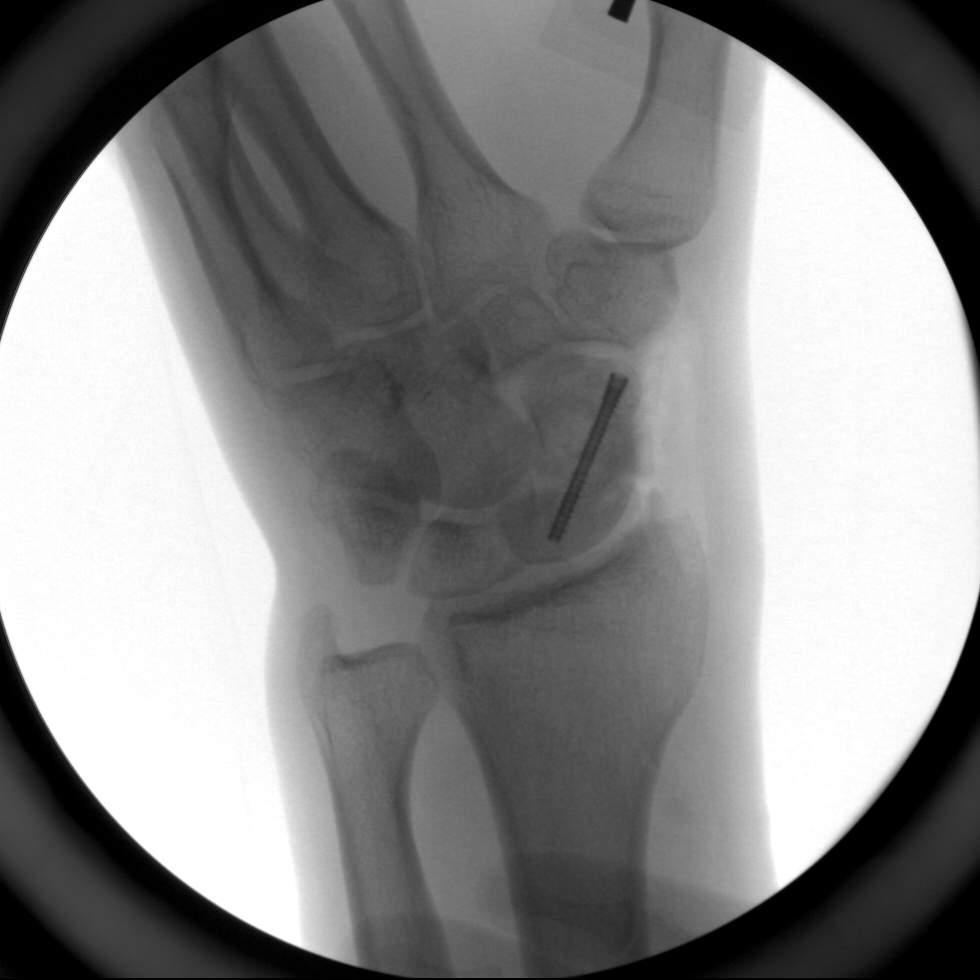
[im 3/3]
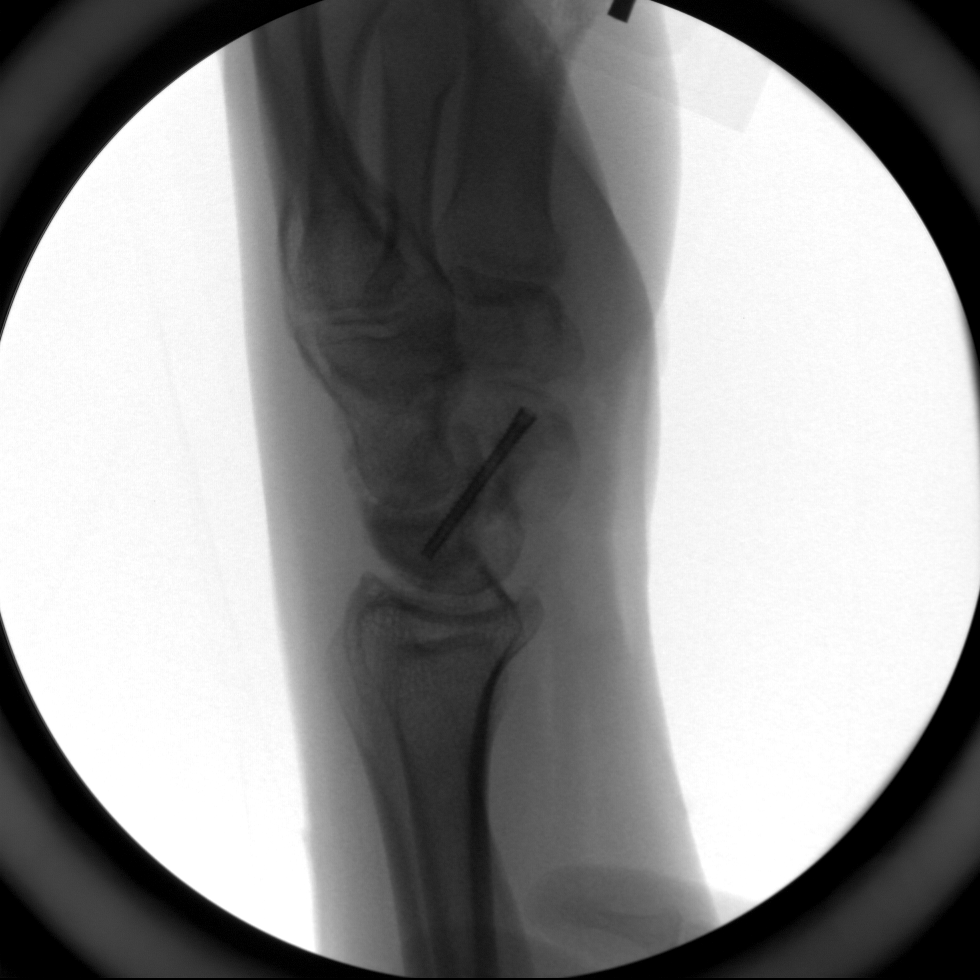

[3 of 3 positions shown; findings below may reference images not displayed]

FLUOROSCOPY TIME:  Radiation Exposure Index (as provided by the
fluoroscopic device): 2.22 mGy

If the device does not provide the exposure index:

Fluoroscopy Time:  48 seconds

Number of Acquired Images:  3
FINDINGS: Fixation screw is noted traversing the scaphoid. Fracture fragments
are in near anatomic alignment. No soft tissue abnormality is noted.
IMPRESSION: ORIF of prior scaphoid fracture with nonunion.

## 2022-08-30 IMAGING — CT CT WRIST*L* W/O CM
2 of 8 series · 10 of 33 positions shown, 12 images · non-contrast
Comparison: CT of the left wrist 07/10/2020.

CLINICAL DATA: History of nonunion of left scaphoid fracture. The
patient underwent fixation of the scaphoid 05/06/2020. Continued
pain. Subsequent encounter.

EXAM:
CT OF THE LEFT WRIST WITHOUT CONTRAST
TECHNIQUE: Multidetector CT imaging was performed according to the standard
protocol. Multiplanar CT image reconstructions were also generated.

[Series 3: wrist 1.50 br60 s3 axial bone hd fov · axial · 0.14mm/px · z∈[-759,-677]mm · 5 of 160 slices shown, 7 images]
[im 27/160  soft-tissue]
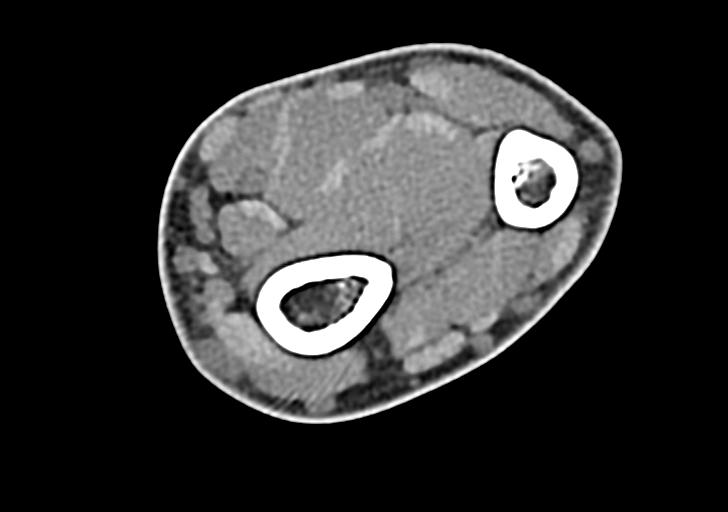
[im 27/160  bone]
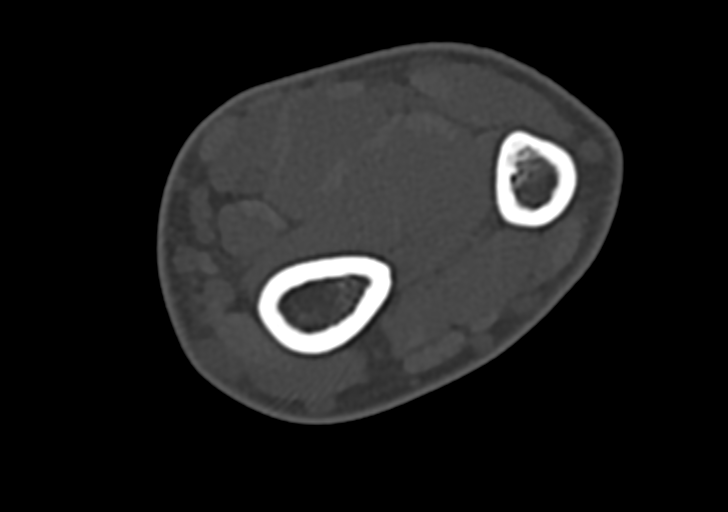
[im 54/160  bone]
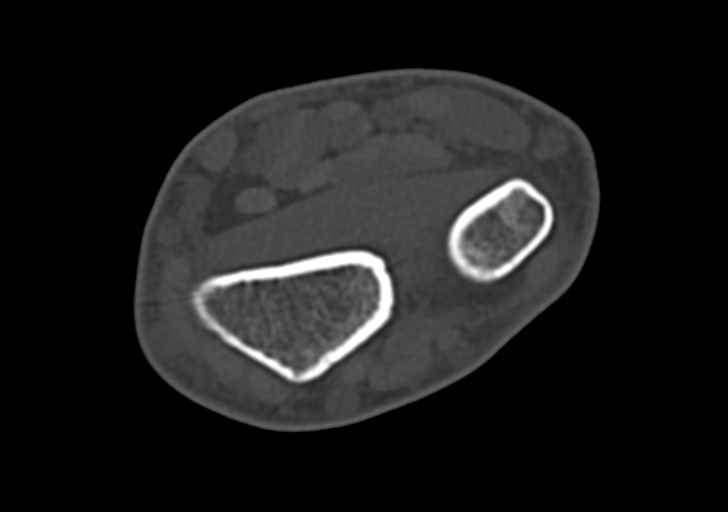
[im 80/160  bone]
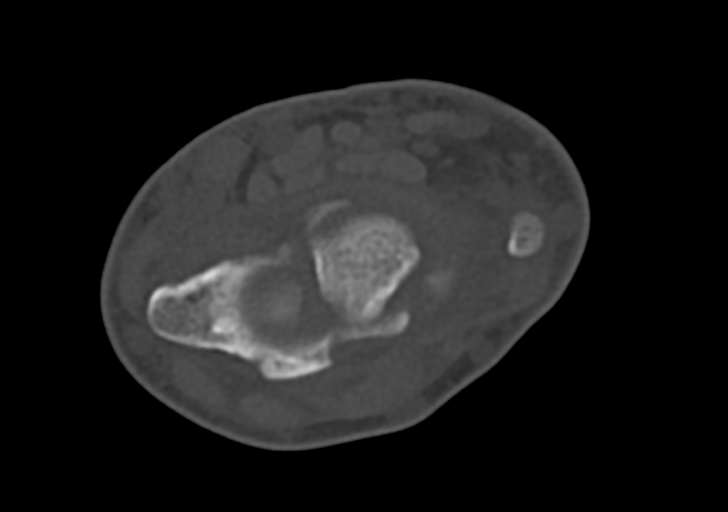
[im 107/160  bone]
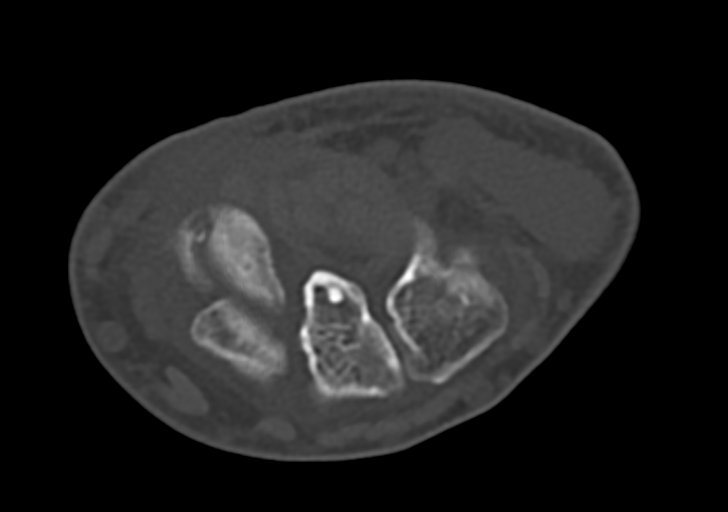
[im 133/160  soft-tissue]
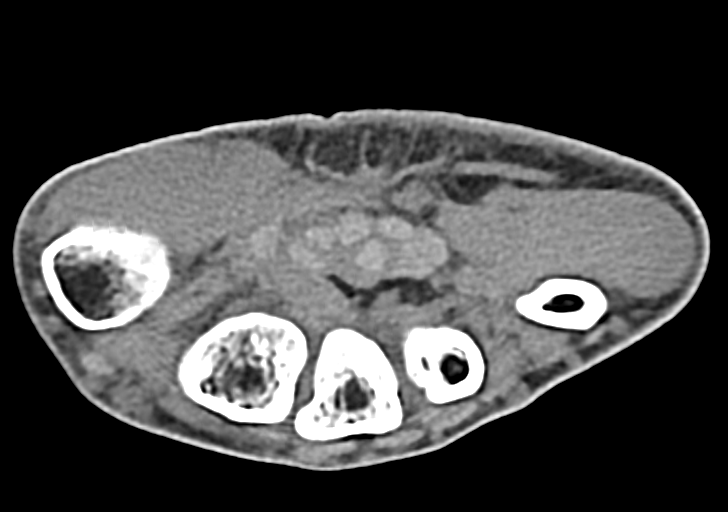
[im 133/160  bone]
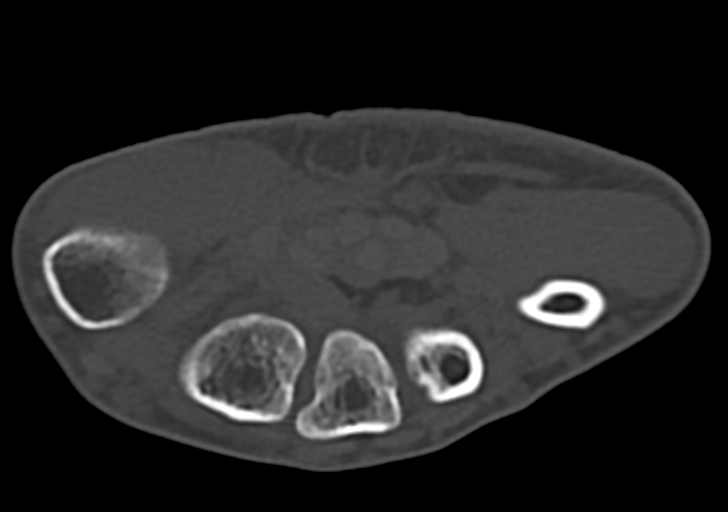

[Series 13: wrist 1.50 br60 s3 sag bone hd fov · sagittal · 0.13mm/px · 5 of 192 slices shown]
[im 32/192  bone]
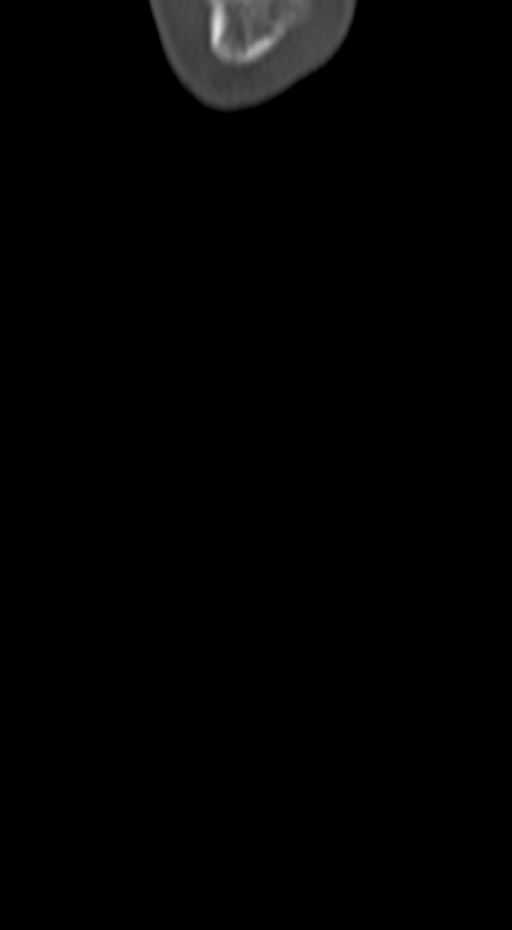
[im 64/192  bone]
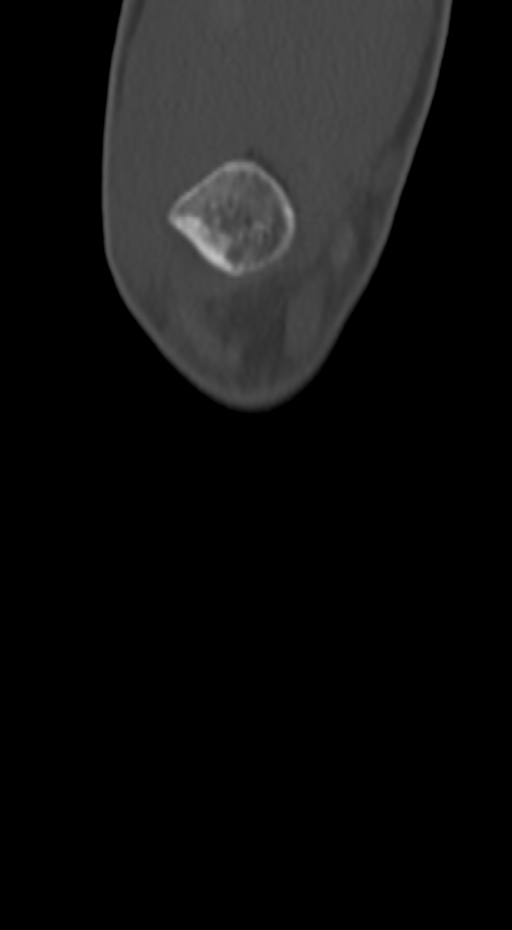
[im 96/192  bone]
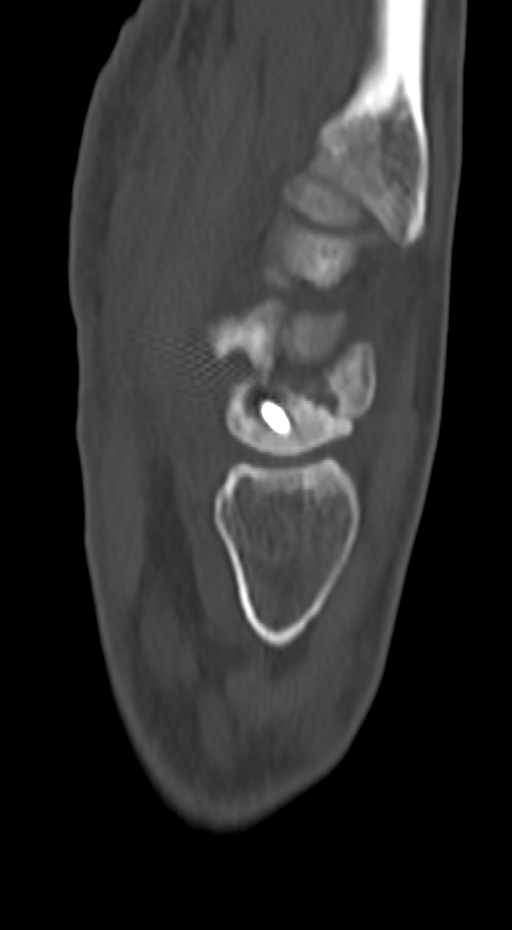
[im 128/192  bone]
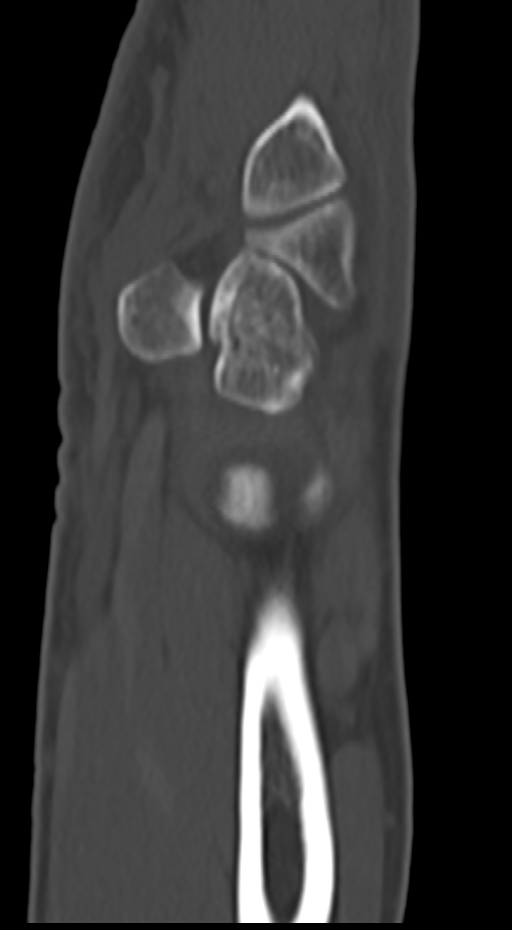
[im 160/192  bone]
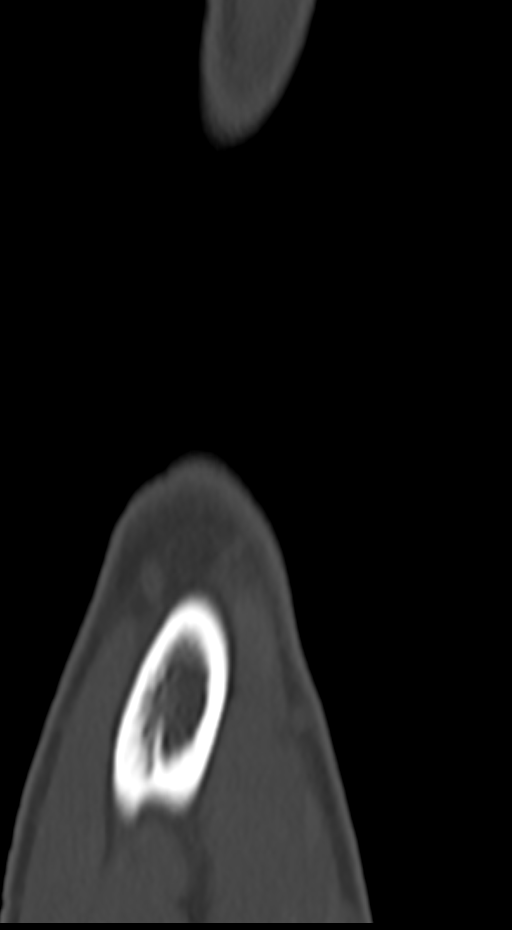

[10 of 33 positions shown; findings below may reference images not displayed]

FINDINGS: Bones/Joint/Cartilage

Single screw remains in place in the scaphoid for fracture fixation.
There is new lucency about the screw compatible with loosening. The
majority of the patient's scaphoid fracture remains a nonunion.
Bridging bone across the volar margin of the fracture seen on the
prior examination is much less conspicuous. A small focus of
bridging bone distal to the screw is unchanged. The scaphoid is
markedly sclerotic throughout. Sclerosis in the distal pole has
worsened since the prior examination. Minimal widening of the
scapholunate interval is again seen. There is dorsal tilt of the
lunate with the capitate lunate ankle of 37 degrees identified,
worse than on the prior study. No new fracture is identified.

Ligaments

Suboptimally assessed by CT.

Muscles and Tendons

Appear normal.

Soft tissues

Negative
IMPRESSION: New lucency about a screw for fixation of a scaphoid fracture
worrisome for loosening. Small area bridging bone across the volar
aspect of the fracture seen on the prior examination is now barely
perceptible. The scaphoid demonstrates increased sclerosis
throughout most compatible with osteonecrosis.

Increased dorsal tilt of the lunate consistent with DISI deformity.
